# Patient Record
Sex: Female | Born: 1990 | Hispanic: Yes | Marital: Married | Smoking: Former smoker
Health system: Southern US, Community
[De-identification: ages and names within clinical notes are randomized; demographics above are authoritative.]

## PROBLEM LIST (undated history)

## (undated) DIAGNOSIS — Z789 Other specified health status: Secondary | ICD-10-CM

## (undated) HISTORY — DX: Other specified health status: Z78.9

## (undated) HISTORY — PX: DILATION AND CURETTAGE OF UTERUS: SHX78

---
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
## 2008-05-31 ENCOUNTER — Inpatient Hospital Stay (HOSPITAL_COMMUNITY): Admission: AD | Admit: 2008-05-31 | Discharge: 2008-06-02 | Payer: Self-pay | Admitting: Obstetrics

## 2010-11-20 NOTE — Group Therapy Note (Signed)
Yvette Avila, Yvette Avila NO.:  [id number]   MEDICAL RECORD NO.:  [id number]          PATIENT TYPE:  WOC   LOCATION:  [facility]                   FACILITY:  [facility]   PHYSICIAN:  Tinnie Gens, MD        DATE OF BIRTH:  10/05/1990   DATE OF SERVICE:  07/10/2007                                  CLINIC NOTE   CHIEF COMPLAINT:  Follow up molar pregnancy.   HISTORY OF PRESENT ILLNESS:  The patient is an 20 year old gravida 3,  para 1-0-2-1 who had a molar pregnancy evacuated by [facility] by Dr. Nicholaus Bloom  on June 23, 2007.  Since that time, she has had no significant  bleeding and no other complaints.   PAST MEDICAL HISTORY:  Negative.   PAST SURGICAL HISTORY:  Negative.   MEDICATIONS:  None.   ALLERGIES:  NONE KNOWN.   OBSTETRICAL HISTORY:  G3, P1, one vaginal delivery, one miscarriage and  one molar pregnancy.   GYNECOLOGICAL HISTORY:  Cycles are regular, come monthly, last for six  days.  She is currently on no birth control.   FAMILY HISTORY:  Significant for diabetes.   SOCIAL HISTORY:  She is a smoker, half pack a day for the past year.  No  other alcohol or drug use.   REVIEW OF SYSTEMS:  A 14-point review of system reviewed.  Please see  GYN history in the chart which is negative.   PHYSICAL EXAMINATION:  VITAL SIGNS:  On exam, her vitals are as noted in  the chart.  GENERAL:  She is well-developed, well-nourished female in no acute  distress.  ABDOMEN:  Soft, nontender, nondistended.  GU:  Normal external female genitalia.  BUS was normal.  Vagina is pink  and rugated.  Cervix is without lesion.  Uterus is six-weeks size,  anteverted, nontender.  Adnexa are without mass or tenderness.   IMPRESSION:  Molar pregnancy.   PLAN:  1. Beta HCG today.  2. Follow up in one week.  Follow up done until level is 0.  Once that      is done, follow monthly.  3. Start the patient on Femcon 135 one p.o. daily.  I have her not to      get pregnant for at least  six months.           ______________________________  Tinnie Gens, MD     TP/MEDQ  D:  07/10/2007  T:  07/10/2007  Job:  [id number]

## 2010-11-20 NOTE — Op Note (Signed)
Yvette Avila, Yvette Avila            ACCOUNT NO.:  [id number]   MEDICAL RECORD NO.:  [id number]          PATIENT TYPE:  AMB   LOCATION:  [facility]                           [facility]  [facility]   PHYSICIAN:  Allie Bossier, MD        DATE OF BIRTH:  02/14/1991   DATE OF PROCEDURE:  06/23/2007  DATE OF DISCHARGE:                               OPERATIVE REPORT   PREOPERATIVE DIAGNOSIS:  Molar pregnancy.   POSTOPERATIVE DIAGNOSIS:  Molar pregnancy.   PATHOLOGY:  Pending.   SURGEON:  Allie Bossier, MD   ANESTHESIA:  MAC and local.   ANESTHESIOLOGIST:  Raul Del, M.D.   COMPLICATIONS:  None.   ESTIMATED BLOOD LOSS:  Minimal.   SPECIMEN:  Uterine curettings.   DETAILED PROCEDURE AND FINDINGS:  The risks, benefits and alternatives  of the surgery were explained and have been accepted.  Consents were  signed.  We discussed a molar pregnancy, and the follow-up is necessary.  She understands that she will definitely follow up in the clinic in 2  weeks for results,   She was taken to the operating room, placed in the dorsal lithotomy  position.  MAC anesthesia was given.  Her vagina was prepped and draped  in the usual sterile fashion.  Bimanual exam revealed a 8-10 week size  retroverted uterus and nonenlarged adnexa.  The posterior lip of the  cervix was grasped with a single-tooth tenaculum.  A cervical block was  done with 1% lidocaine.  The cervix was dilated easily with Shawnie Pons  dilators to accommodate a #8 curved suction curet.  Suction was applied.  The uterus was emptied of moderate to large amount of products of  conception.  Sharp curettage was done in all quadrants in the fundus of  the uterus until a gritty sensation was appreciated.  The tenaculum was  removed and pressure yielded excellent hemostasis on the tenaculum site.  A repeat bimanual exam revealed her uterus to be much smaller and  firm.  She was taken to the recovery room in stable condition with the  instrument, sponge  and needle counts correct.  Please note that her  bladder was emptied with a Robinson catheter in an in-and-out fashion  prior to surgery.     Allie Bossier, MD  Electronically Signed    MCD/MEDQ  D:  06/23/2007  T:  06/24/2007  Job:  [id number]

## 2011-04-02 LAB — CBC
Platelets: 309
RDW: 14.3

## 2011-04-02 LAB — POCT PREGNANCY, URINE
Operator id: 159681
Preg Test, Ur: POSITIVE

## 2011-04-09 LAB — CBC
HCT: 30.2 — ABNORMAL LOW
Hemoglobin: 10.2 — ABNORMAL LOW
Platelets: 287
Platelets: 320
RBC: 3.81 — ABNORMAL LOW
WBC: 12.9 — ABNORMAL HIGH
WBC: 9.4

## 2011-04-09 LAB — RPR: RPR Ser Ql: NONREACTIVE

## 2011-04-12 LAB — GC/CHLAMYDIA PROBE AMP, GENITAL
Chlamydia, DNA Probe: NEGATIVE
GC Probe Amp, Genital: NEGATIVE

## 2011-04-12 LAB — URINALYSIS, ROUTINE W REFLEX MICROSCOPIC
Bilirubin Urine: NEGATIVE
Nitrite: NEGATIVE
Specific Gravity, Urine: 1.02
pH: 6.5

## 2011-04-12 LAB — URINE MICROSCOPIC-ADD ON

## 2011-04-12 LAB — CBC
Hemoglobin: 12.1
MCHC: 33.9
MCV: 80.7
RBC: 4.42
RDW: 13.3

## 2011-04-12 LAB — CROSSMATCH
ABO/RH(D): O POS
Antibody Screen: NEGATIVE

## 2011-04-12 LAB — HCG, QUANTITATIVE, PREGNANCY: hCG, Beta Chain, Quant, S: 24240 — ABNORMAL HIGH

## 2011-04-12 LAB — DIFFERENTIAL
Basophils Absolute: 0.1
Basophils Relative: 1
Eosinophils Absolute: 0.6
Eosinophils Relative: 7 — ABNORMAL HIGH
Monocytes Absolute: 0.6
Monocytes Relative: 7
Neutro Abs: 4.5

## 2011-04-12 LAB — WET PREP, GENITAL: Trich, Wet Prep: NONE SEEN

## 2011-04-12 LAB — POCT PREGNANCY, URINE: Preg Test, Ur: POSITIVE

## 2012-05-13 LAB — OB RESULTS CONSOLE RPR: RPR: NONREACTIVE

## 2012-05-13 LAB — OB RESULTS CONSOLE ANTIBODY SCREEN: Antibody Screen: NEGATIVE

## 2012-05-13 LAB — OB RESULTS CONSOLE ABO/RH

## 2012-07-08 NOTE — L&D Delivery Note (Signed)
Delivery Note At 7:44 AM a viable female was delivered via  (Presentation: ;  ).  APGAR: , ; weight .   Placenta status: , .  Cord:  with the following complications: .  Cord pH: not done  Anesthesia: Other  Episiotomy:  Lacerations:  Suture Repair: 2.0 vicryl Est. Blood Loss (mL):   Mom to postpartum.  Baby to nursery-stable.  Embry Huss A 10/12/2012, 7:54 AM

## 2012-09-17 LAB — OB RESULTS CONSOLE GBS: GBS: NEGATIVE

## 2012-10-08 ENCOUNTER — Telehealth (HOSPITAL_COMMUNITY): Payer: Self-pay | Admitting: *Deleted

## 2012-10-08 ENCOUNTER — Encounter (HOSPITAL_COMMUNITY): Payer: Self-pay | Admitting: *Deleted

## 2012-10-08 NOTE — Telephone Encounter (Signed)
Preadmission screen [phone] interpreter

## 2012-10-12 ENCOUNTER — Inpatient Hospital Stay (HOSPITAL_COMMUNITY)
Admission: RE | Admit: 2012-10-12 | Discharge: 2012-10-14 | DRG: 775 | Disposition: A | Discharge status: Home / Self Care | Payer: Medicaid Other | Source: Ambulatory Visit | Attending: Obstetrics | Admitting: Obstetrics

## 2012-10-12 ENCOUNTER — Encounter (HOSPITAL_COMMUNITY): Payer: Self-pay

## 2012-10-12 LAB — CBC
MCH: 25.9 pg — ABNORMAL LOW (ref 26.0–34.0)
Platelets: 247 10*3/uL (ref 150–400)
RBC: 4.02 MIL/uL (ref 3.87–5.11)
RDW: 15.1 % (ref 11.5–15.5)
WBC: 11 10*3/uL — ABNORMAL HIGH (ref 4.0–10.5)

## 2012-10-12 LAB — RPR: RPR Ser Ql: NONREACTIVE

## 2012-10-12 LAB — TYPE AND SCREEN

## 2012-10-12 MED ORDER — ACETAMINOPHEN 325 MG PO TABS: 650.0000 mg | ORAL_TABLET | ORAL | Status: DC | PRN

## 2012-10-12 MED ORDER — ONDANSETRON HCL 4 MG PO TABS: 4.0000 mg | ORAL_TABLET | ORAL | Status: DC | PRN

## 2012-10-12 MED ORDER — FERROUS SULFATE 325 (65 FE) MG PO TABS
325.0000 mg | ORAL_TABLET | Freq: Two times a day (BID) | ORAL | Status: DC
  Administered 2012-10-13 – 2012-10-14 (×3): 325 mg via ORAL
  Filled 2012-10-12 (×3): qty 1

## 2012-10-12 MED ORDER — WITCH HAZEL-GLYCERIN EX PADS: 1.0000 "application " | MEDICATED_PAD | CUTANEOUS | Status: DC | PRN

## 2012-10-12 MED ORDER — LACTATED RINGERS IV SOLN
INTRAVENOUS | Status: DC
  Administered 2012-10-12: 04:00:00 via INTRAVENOUS

## 2012-10-12 MED ORDER — BUTORPHANOL TARTRATE 1 MG/ML IJ SOLN
1.0000 mg | INTRAMUSCULAR | Status: DC | PRN
  Administered 2012-10-12: 1 mg via INTRAVENOUS
  Filled 2012-10-12: qty 1

## 2012-10-12 MED ORDER — LACTATED RINGERS IV SOLN: 500.0000 mL | INTRAVENOUS | Status: DC | PRN

## 2012-10-12 MED ORDER — FLEET ENEMA 7-19 GM/118ML RE ENEM: 1.0000 | ENEMA | RECTAL | Status: DC | PRN

## 2012-10-12 MED ORDER — NALOXONE HCL 0.4 MG/ML IJ SOLN
INTRAMUSCULAR | Status: AC
  Filled 2012-10-12: qty 1

## 2012-10-12 MED ORDER — ZOLPIDEM TARTRATE 5 MG PO TABS: 5.0000 mg | ORAL_TABLET | Freq: Every evening | ORAL | Status: DC | PRN

## 2012-10-12 MED ORDER — DIBUCAINE 1 % RE OINT: 1.0000 "application " | TOPICAL_OINTMENT | RECTAL | Status: DC | PRN

## 2012-10-12 MED ORDER — ONDANSETRON HCL 4 MG/2ML IJ SOLN: 4.0000 mg | INTRAMUSCULAR | Status: DC | PRN

## 2012-10-12 MED ORDER — OXYTOCIN BOLUS FROM INFUSION: 500.0000 mL | INTRAVENOUS | Status: DC

## 2012-10-12 MED ORDER — CITRIC ACID-SODIUM CITRATE 334-500 MG/5ML PO SOLN: 30.0000 mL | ORAL | Status: DC | PRN

## 2012-10-12 MED ORDER — DIPHENHYDRAMINE HCL 25 MG PO CAPS: 25.0000 mg | ORAL_CAPSULE | Freq: Four times a day (QID) | ORAL | Status: DC | PRN

## 2012-10-12 MED ORDER — TETANUS-DIPHTH-ACELL PERTUSSIS 5-2.5-18.5 LF-MCG/0.5 IM SUSP
0.5000 mL | Freq: Once | INTRAMUSCULAR | Status: AC
  Administered 2012-10-13: 0.5 mL via INTRAMUSCULAR
  Filled 2012-10-12: qty 0.5

## 2012-10-12 MED ORDER — SENNOSIDES-DOCUSATE SODIUM 8.6-50 MG PO TABS
2.0000 | ORAL_TABLET | Freq: Every day | ORAL | Status: DC
  Administered 2012-10-12 – 2012-10-13 (×2): 2 via ORAL

## 2012-10-12 MED ORDER — OXYCODONE-ACETAMINOPHEN 5-325 MG PO TABS
1.0000 | ORAL_TABLET | ORAL | Status: DC | PRN
  Administered 2012-10-12 – 2012-10-13 (×2): 1 via ORAL
  Filled 2012-10-12 (×2): qty 1
  Filled 2012-10-12: qty 2

## 2012-10-12 MED ORDER — LANOLIN HYDROUS EX OINT: TOPICAL_OINTMENT | CUTANEOUS | Status: DC | PRN

## 2012-10-12 MED ORDER — OXYCODONE-ACETAMINOPHEN 5-325 MG PO TABS
1.0000 | ORAL_TABLET | ORAL | Status: DC | PRN
  Administered 2012-10-12 (×4): 1 via ORAL
  Filled 2012-10-12 (×2): qty 1

## 2012-10-12 MED ORDER — BENZOCAINE-MENTHOL 20-0.5 % EX AERO
1.0000 "application " | INHALATION_SPRAY | CUTANEOUS | Status: DC | PRN
  Filled 2012-10-12: qty 56

## 2012-10-12 MED ORDER — IBUPROFEN 600 MG PO TABS
600.0000 mg | ORAL_TABLET | Freq: Four times a day (QID) | ORAL | Status: DC | PRN
  Administered 2012-10-12: 600 mg via ORAL
  Filled 2012-10-12 (×8): qty 1

## 2012-10-12 MED ORDER — IBUPROFEN 600 MG PO TABS
600.0000 mg | ORAL_TABLET | Freq: Four times a day (QID) | ORAL | Status: DC
  Administered 2012-10-12 – 2012-10-14 (×8): 600 mg via ORAL
  Filled 2012-10-12: qty 1

## 2012-10-12 MED ORDER — ONDANSETRON HCL 4 MG/2ML IJ SOLN: 4.0000 mg | Freq: Four times a day (QID) | INTRAMUSCULAR | Status: DC | PRN

## 2012-10-12 MED ORDER — OXYTOCIN 40 UNITS IN LACTATED RINGERS INFUSION - SIMPLE MED
62.5000 mL/h | INTRAVENOUS | Status: DC
  Administered 2012-10-12: 500 mL/h via INTRAVENOUS
  Filled 2012-10-12: qty 1000

## 2012-10-12 MED ORDER — LIDOCAINE HCL (PF) 1 % IJ SOLN
30.0000 mL | INTRAMUSCULAR | Status: DC | PRN
  Administered 2012-10-12: 30 mL via SUBCUTANEOUS
  Filled 2012-10-12 (×2): qty 30

## 2012-10-12 MED ORDER — PRENATAL MULTIVITAMIN CH
1.0000 | ORAL_TABLET | Freq: Every day | ORAL | Status: DC
  Administered 2012-10-12 – 2012-10-14 (×3): 1 via ORAL
  Filled 2012-10-12 (×3): qty 1

## 2012-10-12 MED ORDER — SIMETHICONE 80 MG PO CHEW: 80.0000 mg | CHEWABLE_TABLET | ORAL | Status: DC | PRN

## 2012-10-12 NOTE — H&P (Signed)
This is Dr. Francoise Ceo dictating the history and physical on  Yvette Avila she is a 22 year old gravida 4 para 2012 [facility] 4/12 2039 weeks and 2 days negative GBS she was admitted in labor or with her membranes leaking started at 3:30 this morning on exam her cervix is 6 cm 100% vertex -2 and 4 interrupted the fluids clear and she is inactive labor Past medical history negative Past surgical history negative Social history negative System review noncontributory Physical exam revealed well-developed female in labor HEENT negative Breasts negative Heart regular rhythm no murmurs no gallops Lungs clear to P&A Abdomen term Pelvic as described above Extremities negative

## 2012-10-13 LAB — CBC
HCT: 30.3 % — ABNORMAL LOW (ref 36.0–46.0)
Hemoglobin: 9.7 g/dL — ABNORMAL LOW (ref 12.0–15.0)
MCH: 25.6 pg — ABNORMAL LOW (ref 26.0–34.0)
MCHC: 32 g/dL (ref 30.0–36.0)
MCV: 79.9 fL (ref 78.0–100.0)
RDW: 15.1 % (ref 11.5–15.5)

## 2012-10-13 NOTE — Progress Notes (Signed)
UR chart review completed.  

## 2012-10-13 NOTE — Progress Notes (Signed)
Patient ID: Yvette Avila, female   DOB: [birth year], 22 y.o.   MRN: [id number] Postpartum day one Vital signs normal Fundus firm Lochia moderate No complaints

## 2012-10-14 NOTE — Discharge Summary (Signed)
Obstetric Discharge Summary Reason for Admission: onset of labor Prenatal Procedures: none Intrapartum Procedures: spontaneous vaginal delivery Postpartum Procedures: none Complications-Operative and Postpartum: none Hemoglobin  Date Value Range Status  10/13/2012 9.7* 12.0 - 15.0 g/dL Final     HCT  Date Value Range Status  10/13/2012 30.3* 36.0 - 46.0 % Final    Physical Exam:  General: alert Lochia: appropriate Uterine Fundus: firm Incision: healing well DVT Evaluation: No evidence of DVT seen on physical exam.  Discharge Diagnoses: Term Pregnancy-delivered  Discharge Information: Date: 10/14/2012 Activity: pelvic rest Diet: routine Medications: Percocet Condition: stable Instructions: refer to practice specific booklet Discharge to: home Follow-up Information   Follow up with [name], MD. Schedule an appointment as soon as possible for Avila visit in 6 weeks.   Contact information:   [address] [place] [address] [phone]       Newborn Data: Live born female  Birth Weight: 9 lb 4.7 oz (4215 g) APGAR: 9, 9  Home with mother.  Yvette Avila 10/14/2012, 6:53 AM

## 2012-10-15 ENCOUNTER — Inpatient Hospital Stay (HOSPITAL_COMMUNITY): Admission: AD | Admit: 2012-10-15 | Payer: Self-pay | Source: Ambulatory Visit | Admitting: Obstetrics

## 2013-11-10 ENCOUNTER — Encounter (HOSPITAL_COMMUNITY): Payer: Self-pay | Admitting: Emergency Medicine

## 2013-11-10 ENCOUNTER — Emergency Department (INDEPENDENT_AMBULATORY_CARE_PROVIDER_SITE_OTHER)
Admission: EM | Admit: 2013-11-10 | Discharge: 2013-11-10 | Disposition: A | Discharge status: Home / Self Care | Payer: Self-pay | Source: Home / Self Care | Attending: Family Medicine | Admitting: Family Medicine

## 2013-11-10 DIAGNOSIS — M545 Low back pain, unspecified: Secondary | ICD-10-CM

## 2013-11-10 MED ORDER — CYCLOBENZAPRINE HCL 5 MG PO TABS: 5.0000 mg | ORAL_TABLET | Freq: Every evening | ORAL | Status: DC | PRN

## 2013-11-10 MED ORDER — DICLOFENAC SODIUM 50 MG PO TBEC: 50.0000 mg | DELAYED_RELEASE_TABLET | Freq: Two times a day (BID) | ORAL | Status: DC | PRN
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
## 2013-11-10 NOTE — Discharge Instructions (Signed)
Thank you for coming in today. Come back or go to the emergency room if you notice new weakness new numbness problems walking or bowel or bladder problems.  Back Exercises Back exercises help treat and prevent back injuries. The goal of back exercises is to increase the strength of your abdominal and back muscles and the flexibility of your back. These exercises should be started when you no longer have back pain. Back exercises include:  Pelvic Tilt. Lie on your back with your knees bent. Tilt your pelvis until the lower part of your back is against the floor. Hold this position 5 to 10 sec and repeat 5 to 10 times.  Knee to Chest. Pull first 1 knee up against your chest and hold for 20 to 30 seconds, repeat this with the other knee, and then both knees. This may be done with the other leg straight or bent, whichever feels better.  Sit-Ups or Curl-Ups. Bend your knees 90 degrees. Start with tilting your pelvis, and do a partial, slow sit-up, lifting your trunk only 30 to 45 degrees off the floor. Take at least 2 to 3 seconds for each sit-up. Do not do sit-ups with your knees out straight. If partial sit-ups are difficult, simply do the above but with only tightening your abdominal muscles and holding it as directed.  Hip-Lift. Lie on your back with your knees flexed 90 degrees. Push down with your feet and shoulders as you raise your hips a couple inches off the floor; hold for 10 seconds, repeat 5 to 10 times.  Back arches. Lie on your stomach, propping yourself up on bent elbows. Slowly press on your hands, causing an arch in your low back. Repeat 3 to 5 times. Any initial stiffness and discomfort should lessen with repetition over time.  Shoulder-Lifts. Lie face down with arms beside your body. Keep hips and torso pressed to floor as you slowly lift your head and shoulders off the floor. Do not overdo your exercises, especially in the beginning. Exercises may cause you some mild back discomfort  which lasts for a few minutes; however, if the pain is more severe, or lasts for more than 15 minutes, do not continue exercises until you see your caregiver. Improvement with exercise therapy for back problems is slow.  See your caregivers for assistance with developing a proper back exercise program. Document Released: 08/01/2004 Document Revised: 09/16/2011 Document Reviewed: 04/25/2011 [facility] Patient Information 2014 [place], [place].

## 2013-11-10 NOTE — ED Notes (Signed)
Back pain onset today °

## 2014-05-09 ENCOUNTER — Encounter (HOSPITAL_COMMUNITY): Payer: Self-pay | Admitting: Emergency Medicine

## 2015-06-23 ENCOUNTER — Ambulatory Visit (INDEPENDENT_AMBULATORY_CARE_PROVIDER_SITE_OTHER): Payer: Self-pay

## 2015-06-23 DIAGNOSIS — Z23 Encounter for immunization: Secondary | ICD-10-CM

## 2015-08-14 ENCOUNTER — Other Ambulatory Visit (INDEPENDENT_AMBULATORY_CARE_PROVIDER_SITE_OTHER): Payer: Self-pay

## 2015-08-14 DIAGNOSIS — Z0000 Encounter for general adult medical examination without abnormal findings: Secondary | ICD-10-CM

## 2015-08-15 LAB — COMPREHENSIVE METABOLIC PANEL
A/G RATIO: 1.8 (ref 1.1–2.5)
ALT: 16 IU/L (ref 0–32)
AST: 11 IU/L (ref 0–40)
Albumin: 4.5 g/dL (ref 3.5–5.5)
Alkaline Phosphatase: 52 IU/L (ref 39–117)
BILIRUBIN TOTAL: 0.7 mg/dL (ref 0.0–1.2)
BUN/Creatinine Ratio: 13 (ref 8–20)
BUN: 9 mg/dL (ref 6–20)
CHLORIDE: 102 mmol/L (ref 96–106)
CO2: 21 mmol/L (ref 18–29)
Calcium: 9.1 mg/dL (ref 8.7–10.2)
Creatinine, Ser: 0.72 mg/dL (ref 0.57–1.00)
GFR calc non Af Amer: 116 mL/min/{1.73_m2} (ref >59)
GFR, EST AFRICAN AMERICAN: 134 mL/min/{1.73_m2} (ref >59)
Globulin, Total: 2.5 g/dL (ref 1.5–4.5)
Glucose: 87 mg/dL (ref 65–99)
POTASSIUM: 4.3 mmol/L (ref 3.5–5.2)
Sodium: 137 mmol/L (ref 134–144)
Total Protein: 7 g/dL (ref 6.0–8.5)

## 2015-08-15 LAB — LIPID PANEL W/O CHOL/HDL RATIO
Cholesterol, Total: 168 mg/dL (ref 100–199)
HDL: 56 mg/dL (ref >39)
LDL Calculated: 92 mg/dL (ref 0–99)
TRIGLYCERIDES: 100 mg/dL (ref 0–149)
VLDL Cholesterol Cal: 20 mg/dL (ref 5–40)

## 2015-11-14 ENCOUNTER — Encounter: Payer: Self-pay | Admitting: Internal Medicine

## 2015-11-14 ENCOUNTER — Ambulatory Visit (INDEPENDENT_AMBULATORY_CARE_PROVIDER_SITE_OTHER): Payer: Self-pay | Admitting: Internal Medicine

## 2015-11-14 VITALS — BP 110/74 | HR 92 | Temp 98.4°F | Resp 18 | Ht 62.25 in | Wt 164.5 lb

## 2015-11-14 DIAGNOSIS — B349 Viral infection, unspecified: Secondary | ICD-10-CM

## 2015-11-14 NOTE — Patient Instructions (Addendum)
Try Dayquil during day and Nyquil at night. Ibuprofen 200 mg 2-4 tabs every 6 hours as needed for back pain--take with food Drink water small amounts constantly Stop smoking (PERIOD)

## 2015-11-14 NOTE — Progress Notes (Signed)
   Subjective:    Patient ID: Yvette Avila, female    DOB: [birth year], 25 y.o.   MRN: [id number]  HPI   4 days ago, developed fever up to 101.0 orally and a cough.  3 days ago, aching and feverish, coughing with productive of yellow green phlegm.  2 nights ago, developed upper thoracic back pain that extended down into lumbar area yesterday.  Took Tylenol 1000 mg and that definitely helped back pain.  Did also take Theraflu, which helped, has only taken twice.  Did not take Tylenol the same day.  Coughing hurts her back a bit.  Aching not so much of a problem today. No dyspnea. No N or V.  No diarrhea.  No dysuria, no hematuria. No fever since 2nd day of illness.  No appetite, but drinking fluids.  Did get an Influenza vaccine this year.  No current outpatient prescriptions on file.   No Known Allergies      Review of Systems     Objective:   Physical Exam Congested cough, mildly ill appearing. HEENT;  PERRL, EOMI, TMs pearly gray, nasal mucosa swollen and red, throat without injection, MMM Neck:  Supple, no adenopathy Chest:  Upper airway noise that improves with cough, otherwise CTA with god air movement, resonant to percussion. Abd;  S, NT, No HSM or mass, +BS       Assessment & Plan:  Viral Syndrome:  Push fluids, If using cold remedy with Tylenol or other analgesic, to not take the same analgesic separately.   Try Dayquil/Nyquil combination Cool mist humidifier. Call if worsen with SOB or recurrent fever. Call if develops

## 2016-01-31 ENCOUNTER — Other Ambulatory Visit: Payer: Self-pay

## 2016-01-31 DIAGNOSIS — R7611 Nonspecific reaction to tuberculin skin test without active tuberculosis: Secondary | ICD-10-CM

## 2016-01-31 DIAGNOSIS — T50A95A Adverse effect of other bacterial vaccines, initial encounter: Principal | ICD-10-CM

## 2016-02-02 ENCOUNTER — Other Ambulatory Visit: Payer: Self-pay

## 2016-02-02 DIAGNOSIS — R7611 Nonspecific reaction to tuberculin skin test without active tuberculosis: Secondary | ICD-10-CM

## 2016-02-02 DIAGNOSIS — T50A95A Adverse effect of other bacterial vaccines, initial encounter: Principal | ICD-10-CM

## 2016-02-02 LAB — SPECIMEN STATUS REPORT

## 2016-02-02 LAB — QUANTIFERON TB GOLD ASSAY (BLOOD)

## 2016-02-08 ENCOUNTER — Other Ambulatory Visit: Payer: Self-pay | Admitting: Internal Medicine

## 2016-02-08 DIAGNOSIS — R7612 Nonspecific reaction to cell mediated immunity measurement of gamma interferon antigen response without active tuberculosis: Secondary | ICD-10-CM

## 2016-02-08 LAB — QUANTIFERON IN TUBE
QFT TB AG MINUS NIL VALUE: >10 IU/mL
QUANTIFERON NIL VALUE: 0.23 [IU]/mL
QUANTIFERON TB AG VALUE: >10 IU/mL
QUANTIFERON TB GOLD: POSITIVE — AB

## 2016-02-08 LAB — QUANTIFERON TB GOLD ASSAY (BLOOD)

## 2016-02-20 ENCOUNTER — Other Ambulatory Visit: Payer: Self-pay | Admitting: Infectious Disease

## 2016-02-20 ENCOUNTER — Ambulatory Visit
Admission: RE | Admit: 2016-02-20 | Discharge: 2016-02-20 | Disposition: A | Discharge status: Home / Self Care | Payer: No Typology Code available for payment source | Source: Ambulatory Visit | Attending: Infectious Disease | Admitting: Infectious Disease

## 2016-02-20 DIAGNOSIS — Z111 Encounter for screening for respiratory tuberculosis: Secondary | ICD-10-CM

## 2016-03-05 ENCOUNTER — Ambulatory Visit: Payer: Self-pay | Admitting: Internal Medicine

## 2017-05-12 ENCOUNTER — Ambulatory Visit (INDEPENDENT_AMBULATORY_CARE_PROVIDER_SITE_OTHER): Payer: Self-pay

## 2017-05-12 DIAGNOSIS — Z23 Encounter for immunization: Secondary | ICD-10-CM

## 2018-10-19 ENCOUNTER — Telehealth: Payer: Self-pay | Admitting: Internal Medicine

## 2018-10-19 MED ORDER — TRIAMCINOLONE ACETONIDE 0.1 % EX CREA: 1.0000 "application " | TOPICAL_CREAM | Freq: Two times a day (BID) | CUTANEOUS | 0 refills | Status: DC

## 2018-10-19 NOTE — Telephone Encounter (Signed)
texted me photos of obvious poison ivy on both arms.  Fairly extensive involvement Sending in Triamcinolone cream.

## 2019-02-09 ENCOUNTER — Other Ambulatory Visit (INDEPENDENT_AMBULATORY_CARE_PROVIDER_SITE_OTHER): Payer: Self-pay

## 2019-02-09 ENCOUNTER — Other Ambulatory Visit: Payer: Self-pay

## 2019-02-09 DIAGNOSIS — N912 Amenorrhea, unspecified: Secondary | ICD-10-CM

## 2019-02-09 LAB — POCT URINE PREGNANCY: Preg Test, Ur: POSITIVE — AB

## 2019-02-22 MED ORDER — PRENATAL PLUS IRON 29-1 MG PO TABS: ORAL_TABLET | ORAL | 11 refills | Status: DC

## 2019-02-22 NOTE — Addendum Note (Signed)
Addended by: Marcelino Duster on: 02/22/2019 01:57 PM   Modules accepted: Orders

## 2019-02-22 NOTE — Progress Notes (Signed)
Needs Rx for pnv and letter for adopt a mom.

## 2019-02-23 ENCOUNTER — Encounter: Payer: Self-pay | Admitting: Internal Medicine

## 2019-04-06 ENCOUNTER — Encounter: Payer: Self-pay | Admitting: Obstetrics and Gynecology

## 2019-04-06 ENCOUNTER — Other Ambulatory Visit: Payer: Self-pay

## 2019-04-06 ENCOUNTER — Ambulatory Visit (INDEPENDENT_AMBULATORY_CARE_PROVIDER_SITE_OTHER): Payer: No Typology Code available for payment source | Admitting: Obstetrics and Gynecology

## 2019-04-06 DIAGNOSIS — Z1151 Encounter for screening for human papillomavirus (HPV): Secondary | ICD-10-CM

## 2019-04-06 DIAGNOSIS — Z3A13 13 weeks gestation of pregnancy: Secondary | ICD-10-CM

## 2019-04-06 DIAGNOSIS — Z113 Encounter for screening for infections with a predominantly sexual mode of transmission: Secondary | ICD-10-CM

## 2019-04-06 DIAGNOSIS — Z3480 Encounter for supervision of other normal pregnancy, unspecified trimester: Secondary | ICD-10-CM

## 2019-04-06 DIAGNOSIS — O09A0 Supervision of pregnancy with history of molar pregnancy, unspecified trimester: Secondary | ICD-10-CM

## 2019-04-06 DIAGNOSIS — N898 Other specified noninflammatory disorders of vagina: Secondary | ICD-10-CM

## 2019-04-06 DIAGNOSIS — Z124 Encounter for screening for malignant neoplasm of cervix: Secondary | ICD-10-CM

## 2019-04-06 NOTE — Progress Notes (Signed)
Subjective:  Yvette Avila is a 28 y.o. [id number] at [redacted]w[redacted]d being seen today for first OB visit. EDD by certain LMP. No chronic medical problems or medications. H/O TSVD x 3 without problems. H/O first trimester SAB/Molar pregnancy in 2008.   She is currently monitored for the following issues for this low-risk pregnancy and has Supervision of other normal pregnancy, antepartum and H/O molar pregnancy, antepartum on their problem list.  Patient reports no complaints.  Contractions: Not present. Vag. Bleeding: None.  Movement: Absent. Denies leaking of fluid.   The following portions of the patient's history were reviewed and updated as appropriate: allergies, current medications, past family history, past medical history, past social history, past surgical history and problem list. Problem list updated.  Objective:   Vitals:   04/06/19 1114  BP: 107/70  Pulse: 70  Temp: 97.7 F (36.5 C)  Weight: 181 lb 8 oz (82.3 kg)    Fetal Status: Fetal Heart Rate (bpm): 148   Movement: Absent     General:  Alert, oriented and cooperative. Patient is in no acute distress.  Skin: Skin is warm and dry. No rash noted.   Cardiovascular: Normal heart rate noted  Respiratory: Normal respiratory effort, no problems with respiration noted  Abdomen: Soft, gravid, appropriate for gestational age. Pain/Pressure: Absent     Pelvic:  Cervical exam performed        Extremities: Normal range of motion.  Edema: None  Mental Status: Normal mood and affect. Normal behavior. Normal judgment and thought content.  Breast sym supple no masses nipple discharge or adenopathy  Urinalysis:      Assessment and Plan:  Pregnancy: [id number] at [redacted]w[redacted]d  1. Supervision of other normal pregnancy, antepartum Prenatal labs, care and genetic testing reviewed with pt. - Enroll Patient in Babyscripts - Babyscripts Schedule Optimization - Obstetric Panel, Including HIV - Cervicovaginal ancillary only( [place]) - Cytology -  PAP - Genetic Screening  2. H/O molar pregnancy, antepartum   Preterm labor symptoms and general obstetric precautions including but not limited to vaginal bleeding, contractions, leaking of fluid and fetal movement were reviewed in detail with the patient. Please refer to After Visit Summary for other counseling recommendations.  Return in about 4 weeks (around 05/04/2019) for OB visit, face to face.   Chancy Milroy, MD

## 2019-04-06 NOTE — Progress Notes (Signed)
Pt presents for NOB visit. No complaints today per pt.

## 2019-04-06 NOTE — Patient Instructions (Signed)
First Trimester of Pregnancy The first trimester of pregnancy is from week 1 until the end of week 13 (months 1 through 3). A week after a sperm fertilizes an egg, the egg will implant on the wall of the uterus. This embryo will begin to develop into a baby. Genes from you and your partner will form the baby. The female genes will determine whether the baby will be a boy or a girl. At 6-8 weeks, the eyes and face will be formed, and the heartbeat can be seen on ultrasound. At the end of 12 weeks, all the baby's organs will be formed. Now that you are pregnant, you will want to do everything you can to have a healthy baby. Two of the most important things are to get good prenatal care and to follow your health care provider's instructions. Prenatal care is all the medical care you receive before the baby's birth. This care will help prevent, find, and treat any problems during the pregnancy and childbirth. Body changes during your first trimester Your body goes through many changes during pregnancy. The changes vary from woman to woman.  You may gain or lose a couple of pounds at first.  You may feel sick to your stomach (nauseous) and you may throw up (vomit). If the vomiting is uncontrollable, call your health care provider.  You may tire easily.  You may develop headaches that can be relieved by medicines. All medicines should be approved by your health care provider.  You may urinate more often. Painful urination may mean you have a bladder infection.  You may develop heartburn as a result of your pregnancy.  You may develop constipation because certain hormones are causing the muscles that push stool through your intestines to slow down.  You may develop hemorrhoids or swollen veins (varicose veins).  Your breasts may begin to grow larger and become tender. Your nipples may stick out more, and the tissue that surrounds them (areola) may become darker.  Your gums may bleed and may be  sensitive to brushing and flossing.  Dark spots or blotches (chloasma, mask of pregnancy) may develop on your face. This will likely fade after the baby is born.  Your menstrual periods will stop.  You may have a loss of appetite.  You may develop cravings for certain kinds of food.  You may have changes in your emotions from day to day, such as being excited to be pregnant or being concerned that something may go wrong with the pregnancy and baby.  You may have more vivid and strange dreams.  You may have changes in your hair. These can include thickening of your hair, rapid growth, and changes in texture. Some women also have hair loss during or after pregnancy, or hair that feels dry or thin. Your hair will most likely return to normal after your baby is born. What to expect at prenatal visits During a routine prenatal visit:  You will be weighed to make sure you and the baby are growing normally.  Your blood pressure will be taken.  Your abdomen will be measured to track your baby's growth.  The fetal heartbeat will be listened to between weeks 10 and 14 of your pregnancy.  Test results from any previous visits will be discussed. Your health care provider may ask you:  How you are feeling.  If you are feeling the baby move.  If you have had any abnormal symptoms, such as leaking fluid, bleeding, severe headaches, or abdominal   cramping.  If you are using any tobacco products, including cigarettes, chewing tobacco, and electronic cigarettes.  If you have any questions. Other tests that may be performed during your first trimester include:  Blood tests to find your blood type and to check for the presence of any previous infections. The tests will also be used to check for low iron levels (anemia) and protein on red blood cells (Rh antibodies). Depending on your risk factors, or if you previously had diabetes during pregnancy, you may have tests to check for high blood sugar  that affects pregnant women (gestational diabetes).  Urine tests to check for infections, diabetes, or protein in the urine.  An ultrasound to confirm the proper growth and development of the baby.  Fetal screens for spinal cord problems (spina bifida) and Down syndrome.  HIV (human immunodeficiency virus) testing. Routine prenatal testing includes screening for HIV, unless you choose not to have this test.  You may need other tests to make sure you and the baby are doing well. Follow these instructions at home: Medicines  Follow your health care provider's instructions regarding medicine use. Specific medicines may be either safe or unsafe to take during pregnancy.  Take a prenatal vitamin that contains at least 600 micrograms (mcg) of folic acid.  If you develop constipation, try taking a stool softener if your health care provider approves. Eating and drinking   Eat a balanced diet that includes fresh fruits and vegetables, whole grains, good sources of protein such as meat, eggs, or tofu, and low-fat dairy. Your health care provider will help you determine the amount of weight gain that is right for you.  Avoid raw meat and uncooked cheese. These carry germs that can cause birth defects in the baby.  Eating four or five small meals rather than three large meals a day may help relieve nausea and vomiting. If you start to feel nauseous, eating a few soda crackers can be helpful. Drinking liquids between meals, instead of during meals, also seems to help ease nausea and vomiting.  Limit foods that are high in fat and processed sugars, such as fried and sweet foods.  To prevent constipation: ? Eat foods that are high in fiber, such as fresh fruits and vegetables, whole grains, and beans. ? Drink enough fluid to keep your urine clear or pale yellow. Activity  Exercise only as directed by your health care provider. Most women can continue their usual exercise routine during  pregnancy. Try to exercise for 30 minutes at least 5 days a week. Exercising will help you: ? Control your weight. ? Stay in shape. ? Be prepared for labor and delivery.  Experiencing pain or cramping in the lower abdomen or lower back is a good sign that you should stop exercising. Check with your health care provider before continuing with normal exercises.  Try to avoid standing for long periods of time. Move your legs often if you must stand in one place for a long time.  Avoid heavy lifting.  Wear low-heeled shoes and practice good posture.  You may continue to have sex unless your health care provider tells you not to. Relieving pain and discomfort  Wear a good support bra to relieve breast tenderness.  Take warm sitz baths to soothe any pain or discomfort caused by hemorrhoids. Use hemorrhoid cream if your health care provider approves.  Rest with your legs elevated if you have leg cramps or low back pain.  If you develop varicose veins in   your legs, wear support hose. Elevate your feet for 15 minutes, 3-4 times a day. Limit salt in your diet. Prenatal care  Schedule your prenatal visits by the twelfth week of pregnancy. They are usually scheduled monthly at first, then more often in the last 2 months before delivery.  Write down your questions. Take them to your prenatal visits.  Keep all your prenatal visits as told by your health care provider. This is important. Safety  Wear your seat belt at all times when driving.  Make a list of emergency phone numbers, including numbers for family, friends, the hospital, and police and fire departments. General instructions  Ask your health care provider for a referral to a local prenatal education class. Begin classes no later than the beginning of month 6 of your pregnancy.  Ask for help if you have counseling or nutritional needs during pregnancy. Your health care provider can offer advice or refer you to specialists for help  with various needs.  Do not use hot tubs, steam rooms, or saunas.  Do not douche or use tampons or scented sanitary pads.  Do not cross your legs for long periods of time.  Avoid cat litter boxes and soil used by cats. These carry germs that can cause birth defects in the baby and possibly loss of the fetus by miscarriage or stillbirth.  Avoid all smoking, herbs, alcohol, and medicines not prescribed by your health care provider. Chemicals in these products affect the formation and growth of the baby.  Do not use any products that contain nicotine or tobacco, such as cigarettes and e-cigarettes. If you need help quitting, ask your health care provider. You may receive counseling support and other resources to help you quit.  Schedule a dentist appointment. At home, brush your teeth with a soft toothbrush and be gentle when you floss. Contact a health care provider if:  You have dizziness.  You have mild pelvic cramps, pelvic pressure, or nagging pain in the abdominal area.  You have persistent nausea, vomiting, or diarrhea.  You have a bad smelling vaginal discharge.  You have pain when you urinate.  You notice increased swelling in your face, hands, legs, or ankles.  You are exposed to fifth disease or chickenpox.  You are exposed to German measles (rubella) and have never had it. Get help right away if:  You have a fever.  You are leaking fluid from your vagina.  You have spotting or bleeding from your vagina.  You have severe abdominal cramping or pain.  You have rapid weight gain or loss.  You vomit blood or material that looks like coffee grounds.  You develop a severe headache.  You have shortness of breath.  You have any kind of trauma, such as from a fall or a car accident. Summary  The first trimester of pregnancy is from week 1 until the end of week 13 (months 1 through 3).  Your body goes through many changes during pregnancy. The changes vary from  woman to woman.  You will have routine prenatal visits. During those visits, your health care provider will examine you, discuss any test results you may have, and talk with you about how you are feeling. This information is not intended to replace advice given to you by your health care provider. Make sure you discuss any questions you have with your health care provider. Document Released: 06/18/2001 Document Revised: 06/06/2017 Document Reviewed: 06/05/2016 Elsevier Patient Education  2020 Elsevier Inc.  

## 2019-04-07 LAB — OBSTETRIC PANEL, INCLUDING HIV
Antibody Screen: NEGATIVE
Basophils Absolute: 0 10*3/uL (ref 0.0–0.2)
Basos: 1 %
EOS (ABSOLUTE): 0.1 10*3/uL (ref 0.0–0.4)
Eos: 1 %
HIV Screen 4th Generation wRfx: NONREACTIVE
Hematocrit: 33 % — ABNORMAL LOW (ref 34.0–46.6)
Hemoglobin: 10.9 g/dL — ABNORMAL LOW (ref 11.1–15.9)
Hepatitis B Surface Ag: NEGATIVE
Immature Grans (Abs): 0 10*3/uL (ref 0.0–0.1)
Immature Granulocytes: 0 %
Lymphocytes Absolute: 2.4 10*3/uL (ref 0.7–3.1)
Lymphs: 28 %
MCH: 26.4 pg — ABNORMAL LOW (ref 26.6–33.0)
MCHC: 33 g/dL (ref 31.5–35.7)
MCV: 80 fL (ref 79–97)
Monocytes Absolute: 0.6 10*3/uL (ref 0.1–0.9)
Monocytes: 7 %
Neutrophils Absolute: 5.2 10*3/uL (ref 1.4–7.0)
Neutrophils: 63 %
Platelets: 298 10*3/uL (ref 150–450)
RBC: 4.13 x10E6/uL (ref 3.77–5.28)
RDW: 12.8 % (ref 11.7–15.4)
RPR Ser Ql: NONREACTIVE
Rh Factor: POSITIVE
Rubella Antibodies, IGG: 7.3 index (ref >0.99)
WBC: 8.3 10*3/uL (ref 3.4–10.8)

## 2019-04-07 LAB — CERVICOVAGINAL ANCILLARY ONLY
Bacterial Vaginitis (gardnerella): NEGATIVE
Candida Glabrata: NEGATIVE
Candida Vaginitis: NEGATIVE
Chlamydia: NEGATIVE
Molecular Disclaimer: NEGATIVE
Molecular Disclaimer: NEGATIVE
Molecular Disclaimer: NEGATIVE
Molecular Disclaimer: NEGATIVE
Molecular Disclaimer: NORMAL
Molecular Disclaimer: NORMAL
Neisseria Gonorrhea: NEGATIVE
Trichomonas: NEGATIVE

## 2019-04-09 LAB — CYTOLOGY - PAP
Diagnosis: UNDETERMINED — AB
High risk HPV: NEGATIVE

## 2019-04-09 LAB — CULTURE, OB URINE

## 2019-04-09 LAB — URINE CULTURE, OB REFLEX

## 2019-04-12 ENCOUNTER — Encounter: Payer: Self-pay | Admitting: Obstetrics and Gynecology

## 2019-04-19 ENCOUNTER — Telehealth: Payer: Self-pay

## 2019-04-19 ENCOUNTER — Encounter: Payer: Self-pay | Admitting: Obstetrics and Gynecology

## 2019-04-19 DIAGNOSIS — D563 Thalassemia minor: Secondary | ICD-10-CM | POA: Insufficient documentation

## 2019-04-19 NOTE — Telephone Encounter (Signed)
Error. Sahalie Beth RN 

## 2019-04-19 NOTE — Telephone Encounter (Signed)
-----   Message from Chancy Milroy, MD sent at 04/19/2019  9:16 AM EDT ----- Please refer for genetic counseling due to silent carrier of alpha-thal Thanks Legrand Como

## 2019-04-20 ENCOUNTER — Telehealth: Payer: Self-pay

## 2019-04-20 NOTE — Progress Notes (Signed)
This is an adopt a mom patient so referral to MFM would be out of pocket

## 2019-04-20 NOTE — Telephone Encounter (Signed)
Left message using interpreter. Kathrene Alu RN

## 2019-04-20 NOTE — Telephone Encounter (Signed)
-----   Message from Michael L Ervin, MD sent at 04/19/2019  9:16 AM EDT ----- Please refer for genetic counseling due to silent carrier of alpha-thal Thanks Michael 

## 2019-04-30 ENCOUNTER — Encounter: Payer: Self-pay | Admitting: Obstetrics and Gynecology

## 2019-05-04 ENCOUNTER — Encounter: Payer: Self-pay | Admitting: Obstetrics and Gynecology

## 2019-05-04 ENCOUNTER — Other Ambulatory Visit: Payer: Self-pay

## 2019-05-04 ENCOUNTER — Ambulatory Visit (INDEPENDENT_AMBULATORY_CARE_PROVIDER_SITE_OTHER): Payer: Self-pay | Admitting: Obstetrics and Gynecology

## 2019-05-04 VITALS — BP 116/73 | HR 84 | Wt 182.0 lb

## 2019-05-04 DIAGNOSIS — D563 Thalassemia minor: Secondary | ICD-10-CM

## 2019-05-04 DIAGNOSIS — Z3A17 17 weeks gestation of pregnancy: Secondary | ICD-10-CM

## 2019-05-04 DIAGNOSIS — Z3480 Encounter for supervision of other normal pregnancy, unspecified trimester: Secondary | ICD-10-CM

## 2019-05-04 DIAGNOSIS — O09A0 Supervision of pregnancy with history of molar pregnancy, unspecified trimester: Secondary | ICD-10-CM

## 2019-05-04 NOTE — Progress Notes (Signed)
ROB   CC: None    

## 2019-05-04 NOTE — Patient Instructions (Signed)
Segundo trimestre de embarazo Second Trimester of Pregnancy  El segundo trimestre va desde la semana14 hasta la 27 (desde el mes 4 hasta el 6). Este suele ser el momento en el que mejor se siente. En general, las nuseas matutinas han disminuido o han desaparecido completamente. Tendr ms energa y podr aumentarle el apetito. El beb en gestacin se desarrolla rpidamente. Hacia el final del sexto mes, el beb mide aproximadamente 9 pulgadas (23 cm) y pesa alrededor de 1 libras (700 g). Es probable que sienta al beb moverse entre las 18 y 20 semanas del embarazo. Siga estas indicaciones en su casa: Medicamentos  Tome los medicamentos de venta libre y los recetados solamente como se lo haya indicado el mdico. Algunos medicamentos son seguros para tomar durante el embarazo y otros no lo son.  Tome vitaminas prenatales que contengan por lo menos 600microgramos (?g) de cido flico.  Si tiene dificultad para mover el intestino (estreimiento), tome un medicamento para ablandar las heces (laxante) si su mdico se lo autoriza. Comida y bebida   Ingiera alimentos saludables de manera regular.  No coma carne cruda ni quesos sin cocinar.  Si obtiene poca cantidad de calcio de los alimentos que ingiere, consulte a su mdico sobre la posibilidad de tomar un suplemento diario de calcio.  Evite el consumo de alimentos ricos en grasas y azcares, como los alimentos fritos y los dulces.  Si tiene malestar estomacal (nuseas) o devuelve (vomita): ? Ingiera 4 o 5comidas pequeas por da en lugar de 3abundantes. ? Intente comer algunas galletitas saladas. ? Beba lquidos entre las comidas, en lugar de hacerlo durante estas.  Para evitar el estreimiento: ? Consuma alimentos ricos en fibra, como frutas y verduras frescas, cereales integrales y frijoles. ? Beba suficiente lquido para mantener el pis (orina) claro o de color amarillo plido. Actividad  Haga ejercicios solamente como se lo haya  indicado el mdico. Interrumpa la actividad fsica si comienza a tener calambres.  No haga ejercicio si hace demasiado calor, hay demasiada humedad o se encuentra en un lugar de mucha altura (altitud alta).  Evite levantar pesos excesivos.  Use zapatos con tacones bajos. Mantenga una buena postura al sentarse y pararse.  Puede continuar teniendo relaciones sexuales, a menos que el mdico le indique lo contrario. Alivio del dolor y del malestar  Use un sostn que le brinde buen soporte si sus mamas estn sensibles.  Dese baos de asiento con agua tibia para aliviar el dolor o las molestias causadas por las hemorroides. Use una crema para las hemorroides si el mdico la autoriza.  Descanse con las piernas elevadas si tiene calambres o dolor de cintura.  Si desarrolla venas hinchadas y abultadas (vrices) en las piernas: ? Use medias de compresin o medias de descanso como se lo haya indicado el mdico. ? Levante (eleve) los pies durante 15minutos, 3 o 4veces por da. ? Limite el consumo de sal en sus alimentos. Cuidado prenatal  Escriba sus preguntas. Llvelas cuando concurra a las visitas prenatales.  Concurra a todas las visitas prenatales como se lo haya indicado el mdico. Esto es importante. Seguridad  Colquese el cinturn de seguridad cuando conduzca.  Haga una lista de los nmeros de telfono de emergencia, que incluya los nmeros de telfono de familiares, amigos, el hospital, as como los departamentos de polica y bomberos. Instrucciones generales  Consulte a su mdico sobre los alimentos que debe comer o pdale que la ayude a encontrar a quien pueda aconsejarla si necesita ese servicio.    Consulte a su mdico acerca de dnde se dictan clases prenatales cerca de donde vive. Comience las clases antes del mes 6 de embarazo.  No se d baos de inmersin en agua caliente, baos turcos ni saunas.  No se haga duchas vaginales ni use tampones o toallas higinicas perfumadas.   No mantenga las piernas cruzadas durante mucho tiempo.  Vaya al dentista si an no lo hizo. Use un cepillo de cerdas suaves para cepillarse los dientes. Psese el hilo dental suavemente.  No fume, no consuma hierbas ni beba alcohol. No tome frmacos que el mdico no haya autorizado.  No consuma ningn producto que contenga nicotina o tabaco, como cigarrillos y cigarrillos electrnicos. Si necesita ayuda para dejar de fumar, consulte al mdico.  Evite el contacto con las bandejas sanitarias de los gatos y la tierra que estos animales usan. Estos elementos contienen bacterias que pueden causar defectos congnitos al beb y la posible prdida del beb (aborto espontneo) o la muerte fetal. Comunquese con un mdico si:  Tiene clicos leves o siente presin en la parte baja del vientre.  Tiene dolor al hacer pis (orinar).  Advierte un lquido con olor ftido que proviene de la vagina.  Tiene malestar estomacal (nuseas), devuelve (vomita) o tiene deposiciones acuosas (diarrea).  Sufre un dolor persistente en el abdomen.  Siente mareos. Solicite ayuda de inmediato si:  Tiene fiebre.  Tiene una prdida de lquido por la vagina.  Tiene sangrado o pequeas prdidas vaginales.  Siente dolor intenso o clicos en el abdomen.  Sube o baja de peso rpidamente.  Tiene dificultades para recuperar el aliento y siente dolor en el pecho.  Sbitamente se le hinchan mucho el rostro, las manos, los tobillos, los pies o las piernas.  No ha sentido los movimientos del beb durante una hora.  Siente un dolor de cabeza intenso que no se alivia al tomar medicamentos.  Tiene dificultad para ver. Resumen  El segundo trimestre va desde la semana14 hasta la 27, desde el mes 4 hasta el 6. Este suele ser el momento en el que mejor se siente.  Para cuidarse y cuidar a su beb en gestacin, debe comer alimentos saludables, tomar medicamentos solamente si su mdico le indica que lo haga y hacer  actividades que sean seguras para usted y su beb.  Llame al mdico si se enferma o si nota algo inusual acerca de su embarazo. Tambin llame al mdico si necesita ayuda para saber qu alimentos debe comer o si quiere saber qu actividades puede realizar de forma segura. Esta informacin no tiene como fin reemplazar el consejo del mdico. Asegrese de hacerle al mdico cualquier pregunta que tenga. Document Released: 02/24/2013 Document Revised: 03/19/2017 Document Reviewed: 03/19/2017 Elsevier Patient Education  2020 Elsevier Inc.  

## 2019-05-04 NOTE — Progress Notes (Signed)
Subjective:  Yvette Avila is a 28 y.o. [id number] at [redacted]w[redacted]d being seen today for ongoing prenatal care.  She is currently monitored for the following issues for this low-risk pregnancy and has Supervision of other normal pregnancy, antepartum; H/O molar pregnancy, antepartum; and Alpha thalassemia silent carrier on their problem list.  Patient reports no complaints.   . Vag. Bleeding: None.  Movement: Absent. Denies leaking of fluid.   The following portions of the patient's history were reviewed and updated as appropriate: allergies, current medications, past family history, past medical history, past social history, past surgical history and problem list. Problem list updated.  Objective:   Vitals:   05/04/19 0819  BP: 116/73  Pulse: 84  Weight: 182 lb (82.6 kg)    Fetal Status: Fetal Heart Rate (bpm): 140   Movement: Absent     General:  Alert, oriented and cooperative. Patient is in no acute distress.  Skin: Skin is warm and dry. No rash noted.   Cardiovascular: Normal heart rate noted  Respiratory: Normal respiratory effort, no problems with respiration noted  Abdomen: Soft, gravid, appropriate for gestational age. Pain/Pressure: Absent     Pelvic:  Cervical exam deferred        Extremities: Normal range of motion.  Edema: None  Mental Status: Normal mood and affect. Normal behavior. Normal judgment and thought content.   Urinalysis:      Assessment and Plan:  Pregnancy: [id number] at [redacted]w[redacted]d  1. Supervision of other normal pregnancy, antepartum Stable Anatomy scanordered - AFP, Serum, Open Spina Bifida  2. H/O molar pregnancy, antepartum   3. Alpha thalassemia silent carrier Discussed with pt. Has received genetic counseling  Preterm labor symptoms and general obstetric precautions including but not limited to vaginal bleeding, contractions, leaking of fluid and fetal movement were reviewed in detail with the patient. Please refer to After Visit Summary for other  counseling recommendations.  Return in about 4 weeks (around 06/01/2019) for OB visit, face to face.   Chancy Milroy, MD

## 2019-05-06 LAB — AFP, SERUM, OPEN SPINA BIFIDA
AFP MoM: 1.01
AFP Value: 34 ng/mL
Gest. Age on Collection Date: 17 weeks
Maternal Age At EDD: 30.8 yr
OSBR Risk 1 IN: 10000
Test Results:: NEGATIVE
Weight: 182 [lb_av]

## 2019-06-01 ENCOUNTER — Encounter: Payer: Self-pay | Admitting: Obstetrics and Gynecology

## 2019-06-01 ENCOUNTER — Other Ambulatory Visit: Payer: Self-pay

## 2019-06-01 ENCOUNTER — Ambulatory Visit (INDEPENDENT_AMBULATORY_CARE_PROVIDER_SITE_OTHER): Payer: Self-pay | Admitting: Obstetrics and Gynecology

## 2019-06-01 VITALS — BP 104/67 | HR 86 | Wt 186.0 lb

## 2019-06-01 DIAGNOSIS — D563 Thalassemia minor: Secondary | ICD-10-CM

## 2019-06-01 DIAGNOSIS — O09A2 Supervision of pregnancy with history of molar pregnancy, second trimester: Secondary | ICD-10-CM

## 2019-06-01 DIAGNOSIS — O09A0 Supervision of pregnancy with history of molar pregnancy, unspecified trimester: Secondary | ICD-10-CM

## 2019-06-01 DIAGNOSIS — Z3A21 21 weeks gestation of pregnancy: Secondary | ICD-10-CM

## 2019-06-01 DIAGNOSIS — Z3480 Encounter for supervision of other normal pregnancy, unspecified trimester: Secondary | ICD-10-CM

## 2019-06-01 NOTE — Patient Instructions (Signed)

## 2019-06-01 NOTE — Progress Notes (Signed)
Subjective:  Yvette Avila is a 28 y.o. [id number] at [redacted]w[redacted]d being seen today for ongoing prenatal care.  She is currently monitored for the following issues for this low-risk pregnancy and has Supervision of other normal pregnancy, antepartum; H/O molar pregnancy, antepartum; and Alpha thalassemia silent carrier on their problem list.  Patient reports no complaints.  Contractions: Not present. Vag. Bleeding: None.  Movement: Present. Denies leaking of fluid.   The following portions of the patient's history were reviewed and updated as appropriate: allergies, current medications, past family history, past medical history, past social history, past surgical history and problem list. Problem list updated.  Objective:   Vitals:   06/01/19 0813  BP: 104/67  Pulse: 86  Weight: 186 lb (84.4 kg)    Fetal Status: Fetal Heart Rate (bpm): 149   Movement: Present     General:  Alert, oriented and cooperative. Patient is in no acute distress.  Skin: Skin is warm and dry. No rash noted.   Cardiovascular: Normal heart rate noted  Respiratory: Normal respiratory effort, no problems with respiration noted  Abdomen: Soft, gravid, appropriate for gestational age. Pain/Pressure: Absent     Pelvic:  Cervical exam deferred        Extremities: Normal range of motion.  Edema: None  Mental Status: Normal mood and affect. Normal behavior. Normal judgment and thought content.   Urinalysis:      Assessment and Plan:  Pregnancy: [id number] at [redacted]w[redacted]d  1. Supervision of other normal pregnancy, antepartum Stable Glucola next visit  2. Alpha thalassemia silent carrier S/P genetic counseling  3. H/O molar pregnancy, antepartum Stable  Preterm labor symptoms and general obstetric precautions including but not limited to vaginal bleeding, contractions, leaking of fluid and fetal movement were reviewed in detail with the patient. Please refer to After Visit Summary for other counseling recommendations.   Return in about 4 weeks (around 06/29/2019) for OB visit, fasting for Glucola.   Chancy Milroy, MD

## 2019-06-24 ENCOUNTER — Telehealth: Payer: Self-pay | Admitting: *Deleted

## 2019-06-24 NOTE — Telephone Encounter (Signed)
Spoke with [facility] regarding pt invoice acct. Made them aware that pt is non insured and she should be 100% indigent for this invoice (10/27 lab draw) Requisition with that noted to be faxed to [facility] today.

## 2019-06-29 ENCOUNTER — Other Ambulatory Visit: Payer: No Typology Code available for payment source

## 2019-06-29 ENCOUNTER — Encounter: Payer: No Typology Code available for payment source | Admitting: Obstetrics and Gynecology

## 2019-07-07 ENCOUNTER — Other Ambulatory Visit: Payer: No Typology Code available for payment source

## 2019-07-07 ENCOUNTER — Encounter: Payer: No Typology Code available for payment source | Admitting: Obstetrics and Gynecology

## 2019-07-09 NOTE — L&D Delivery Note (Addendum)
OB/GYN Faculty Practice Delivery Note  Yvette Avila is a 29 y.o. [id number] s/p VD at [redacted]w[redacted]d. She was admitted for SOL.   ROM: 0h 16m with clear fluid GBS Status:  Negative/-- (03/10 1054) Maximum Maternal Temperature: 97.9 F   Labor Progress: Patient presented to L&D for SOL. Initial SVE: 5/90/-2. Patient had AROM and then quickly delivered.  Delivery Date/Time: 10/11/19 at 1330  Delivery: Called to room and patient was complete and about to start pushing. Head delivered ROA with compound left hand presentation. No nuchal cord present. Shoulder and body delivered in usual fashion. Infant with spontaneous cry, placed on mother's abdomen, dried and stimulated. Cord clamped x 2 after 1-minute delay, and cut by FOB. Cord blood drawn. Placenta delivered spontaneously with gentle cord traction. Fundus firm with massage and Pitocin. Labia, perineum, vagina, and cervix inspected inspected with a first degree laceration repaired with 3.0 Vicryl in a standard fashion.  Baby Weight: pending   Placenta: Sent to L&D Complications: None Lacerations: 1st degree  EBL: 200 mL Analgesia: Epidural  Infant:  APGAR (1 MIN): 8   APGAR (5 MINS): 9   APGAR (10 MINS):     Katha Cabal, DO PGY-1, [facility] 10/11/2019 2:00 PM    OB FELLOW DELIVERY ATTESTATION  I was gloved and present for the delivery in its entirety, and I agree with the above resident's note.    Jerilynn Birkenhead, MD [facility] Fellow, [facility] for [employer], [facility]

## 2019-07-15 ENCOUNTER — Other Ambulatory Visit: Payer: No Typology Code available for payment source

## 2019-07-15 ENCOUNTER — Encounter: Payer: Self-pay | Admitting: Nurse Practitioner

## 2019-07-15 ENCOUNTER — Ambulatory Visit (INDEPENDENT_AMBULATORY_CARE_PROVIDER_SITE_OTHER): Payer: No Typology Code available for payment source | Admitting: Nurse Practitioner

## 2019-07-15 ENCOUNTER — Other Ambulatory Visit: Payer: Self-pay

## 2019-07-15 DIAGNOSIS — Z3482 Encounter for supervision of other normal pregnancy, second trimester: Secondary | ICD-10-CM

## 2019-07-15 DIAGNOSIS — Z3480 Encounter for supervision of other normal pregnancy, unspecified trimester: Secondary | ICD-10-CM

## 2019-07-15 DIAGNOSIS — Z3A27 27 weeks gestation of pregnancy: Secondary | ICD-10-CM

## 2019-07-15 DIAGNOSIS — Z23 Encounter for immunization: Secondary | ICD-10-CM

## 2019-07-15 NOTE — Progress Notes (Signed)
    Subjective:  Yvette Avila is a 29 y.o. [id number] at [redacted]w[redacted]d being seen today for ongoing prenatal care.  She is currently monitored for the following issues for this low-risk pregnancy and has Supervision of other normal pregnancy, antepartum; H/O molar pregnancy, antepartum; and Alpha thalassemia silent carrier on their problem list. Client speaks English well.    Patient reports lower abdominal pain especially at night.  Contractions: Not present. Vag. Bleeding: None.  Movement: Present. Denies leaking of fluid.   The following portions of the patient's history were reviewed and updated as appropriate: allergies, current medications, past family history, past medical history, past social history, past surgical history and problem list. Problem list updated.  Objective:   Vitals:   07/15/19 0821  BP: 109/72  Pulse: 74  Weight: 189 lb (85.7 kg)    Fetal Status: Fetal Heart Rate (bpm): 140 Fundal Height: 30 cm Movement: Present     General:  Alert, oriented and cooperative. Patient is in no acute distress.  Skin: Skin is warm and dry. No rash noted.   Cardiovascular: Normal heart rate noted  Respiratory: Normal respiratory effort, no problems with respiration noted  Abdomen: Soft, gravid, appropriate for gestational age. Pain/Pressure: Present     Pelvic:  Cervical exam deferred        Extremities: Normal range of motion.  Edema: Trace  Mental Status: Normal mood and affect. Normal behavior. Normal judgment and thought content.   Urinalysis:      Assessment and Plan:  Pregnancy: [id number] at [redacted]w[redacted]d  1. Supervision of other normal pregnancy, antepartum Has BP cuff and reminded to check BP weekly Works at [employer]  2.  Lower Abdominal pain and back pain Maternity support belt - no insurance Drink at least 8 8-oz glasses of water every day. Back stretching exercises  Preterm labor symptoms and general obstetric precautions including but not limited to  vaginal bleeding, contractions, leaking of fluid and fetal movement were reviewed in detail with the patient. Please refer to After Visit Summary for other counseling recommendations.  Return in about 2 weeks (around 07/29/2019) for virtual ROB.  Nolene Bernheim, RN, MSN, NP-BC Nurse Practitioner, [facility] for [employer], [facility] 07/15/2019 9:02 AM

## 2019-07-16 LAB — GLUCOSE TOLERANCE, 2 HOURS W/ 1HR
Glucose, 1 hour: 174 mg/dL (ref 65–179)
Glucose, 2 hour: 147 mg/dL (ref 65–152)
Glucose, Fasting: 89 mg/dL (ref 65–91)

## 2019-07-16 LAB — CBC
Hematocrit: 33.3 % — ABNORMAL LOW (ref 34.0–46.6)
Hemoglobin: 10.8 g/dL — ABNORMAL LOW (ref 11.1–15.9)
MCH: 26.4 pg — ABNORMAL LOW (ref 26.6–33.0)
MCHC: 32.4 g/dL (ref 31.5–35.7)
MCV: 81 fL (ref 79–97)
Platelets: 318 10*3/uL (ref 150–450)
RBC: 4.09 x10E6/uL (ref 3.77–5.28)
RDW: 13.8 % (ref 11.7–15.4)
WBC: 10.1 10*3/uL (ref 3.4–10.8)

## 2019-07-16 LAB — RPR: RPR Ser Ql: NONREACTIVE

## 2019-07-16 LAB — HIV ANTIBODY (ROUTINE TESTING W REFLEX): HIV Screen 4th Generation wRfx: NONREACTIVE

## 2019-07-17 LAB — URINE CULTURE: Organism ID, Bacteria: NO GROWTH

## 2019-07-21 ENCOUNTER — Other Ambulatory Visit (INDEPENDENT_AMBULATORY_CARE_PROVIDER_SITE_OTHER): Payer: Self-pay

## 2019-07-21 DIAGNOSIS — B349 Viral infection, unspecified: Secondary | ICD-10-CM

## 2019-07-22 LAB — NOVEL CORONAVIRUS, NAA: SARS-CoV-2, NAA: NOT DETECTED

## 2019-07-29 ENCOUNTER — Telehealth (INDEPENDENT_AMBULATORY_CARE_PROVIDER_SITE_OTHER): Payer: No Typology Code available for payment source | Admitting: Obstetrics and Gynecology

## 2019-07-29 ENCOUNTER — Encounter: Payer: Self-pay | Admitting: Obstetrics and Gynecology

## 2019-07-29 DIAGNOSIS — Z3A29 29 weeks gestation of pregnancy: Secondary | ICD-10-CM

## 2019-07-29 DIAGNOSIS — Z3480 Encounter for supervision of other normal pregnancy, unspecified trimester: Secondary | ICD-10-CM

## 2019-07-29 DIAGNOSIS — D563 Thalassemia minor: Secondary | ICD-10-CM

## 2019-07-29 DIAGNOSIS — O99113 Other diseases of the blood and blood-forming organs and certain disorders involving the immune mechanism complicating pregnancy, third trimester: Secondary | ICD-10-CM

## 2019-07-29 NOTE — Progress Notes (Signed)
   TELEHEALTH OBSTETRICS PRENATAL VIRTUAL VIDEO VISIT ENCOUNTER NOTE  Provider location: [facility] for [facility] at [place]   I connected with Yvette Avila on 07/29/19 at  8:15 AM EST by MyChart Video Encounter at home and verified that I am speaking with the correct person using two identifiers.   I discussed the limitations, risks, security and privacy concerns of performing an evaluation and management service virtually and the availability of in person appointments. I also discussed with the patient that there may be a patient responsible charge related to this service. The patient expressed understanding and agreed to proceed. Subjective:  Yvette Avila is a 29 y.o. [id number] at [redacted]w[redacted]d being seen today for ongoing prenatal care.  She is currently monitored for the following issues for this low-risk pregnancy and has Supervision of other normal pregnancy, antepartum; H/O molar pregnancy, antepartum; and Alpha thalassemia silent carrier on their problem list.  Patient reports no complaints.  Contractions: Not present. Vag. Bleeding: None.  Movement: Present. Denies any leaking of fluid.   The following portions of the patient's history were reviewed and updated as appropriate: allergies, current medications, past family history, past medical history, past social history, past surgical history and problem list.   Objective:  There were no vitals filed for this visit.  Fetal Status:     Movement: Present     General:  Alert, oriented and cooperative. Patient is in no acute distress.  Respiratory: Normal respiratory effort, no problems with respiration noted  Mental Status: Normal mood and affect. Normal behavior. Normal judgment and thought content.  Rest of physical exam deferred due to type of encounter  Imaging: No results found.  Assessment and Plan:  Pregnancy: [id number] at [redacted]w[redacted]d  1. Supervision of other normal pregnancy, antepartum Pt interested in  tubal/vasectomy but does not have insurance, reviewed these as well as LARCs that can be obtained at [facility] for lower cost, she will consider  2. Alpha thalassemia silent carrier S/p genetic counseling  Preterm labor symptoms and general obstetric precautions including but not limited to vaginal bleeding, contractions, leaking of fluid and fetal movement were reviewed in detail with the patient. I discussed the assessment and treatment plan with the patient. The patient was provided an opportunity to ask questions and all were answered. The patient agreed with the plan and demonstrated an understanding of the instructions. The patient was advised to call back or seek an in-person office evaluation/go to MAU at [facility] for any urgent or concerning symptoms. Please refer to After Visit Summary for other counseling recommendations.   I provided 20 minutes of face-to-face time during this encounter.  Return in about 2 weeks (around 08/12/2019) for low OB, virtual.  No future appointments.  Conan Bowens, MD Center for [facility], [facility]

## 2019-07-29 NOTE — Progress Notes (Signed)
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  CC: NONE

## 2019-08-12 ENCOUNTER — Telehealth: Payer: No Typology Code available for payment source | Admitting: Advanced Practice Midwife

## 2019-08-31 ENCOUNTER — Telehealth (INDEPENDENT_AMBULATORY_CARE_PROVIDER_SITE_OTHER): Payer: No Typology Code available for payment source | Admitting: Obstetrics and Gynecology

## 2019-08-31 ENCOUNTER — Encounter: Payer: Self-pay | Admitting: Obstetrics and Gynecology

## 2019-08-31 DIAGNOSIS — Z3A34 34 weeks gestation of pregnancy: Secondary | ICD-10-CM

## 2019-08-31 DIAGNOSIS — Z3483 Encounter for supervision of other normal pregnancy, third trimester: Secondary | ICD-10-CM

## 2019-08-31 DIAGNOSIS — Z3480 Encounter for supervision of other normal pregnancy, unspecified trimester: Secondary | ICD-10-CM

## 2019-08-31 MED ORDER — BLOOD PRESSURE KIT DEVI: 1.0000 | 0 refills | Status: AC | PRN

## 2019-08-31 NOTE — Patient Instructions (Signed)

## 2019-08-31 NOTE — Progress Notes (Signed)
   MY CHART VIDEO VIRTUAL OBSTETRICS VISIT ENCOUNTER NOTE  I connected with Shequilla Alfaro-Ruiz on 08/31/19 at  4:00 PM EST by My Chart video at home and verified that I am speaking with the correct person using two identifiers.   I discussed the limitations, risks, security and privacy concerns of performing an evaluation and management service by My Chart video and the availability of in person appointments. I also discussed with the patient that there may be a patient responsible charge related to this service. The patient expressed understanding and agreed to proceed.  Subjective:  Yvette Avila is a 29 y.o. [id number] at 69w2dbeing followed for ongoing prenatal care.  She is currently monitored for the following issues for this low-risk pregnancy and has Supervision of other normal pregnancy, antepartum; H/O molar pregnancy, antepartum; and Alpha thalassemia silent carrier on their problem list.  Patient reports occasional contractions and some pelvic pressure. Reports fetal movement. Denies any contractions, bleeding or leaking of fluid.   The following portions of the patient's history were reviewed and updated as appropriate: allergies, current medications, past family history, past medical history, past social history, past surgical history and problem list.   Objective:   General:  Alert, oriented and cooperative.   Mental Status: Normal mood and affect perceived. Normal judgment and thought content.  Rest of physical exam deferred due to type of encounter  LMP 01/03/2019   **VS not done d/t patient not having BP cuff or scale at home and she was driving during visit.  Assessment and Plan:  Pregnancy: [id number] 358w2d1. Supervision of other normal pregnancy, antepartum - Blood Pressure Monitoring (BLOOD PRESSURE KIT) DEVI; 1 Device by Does not apply route as needed.  Dispense: 1 each; Refill: 0 - Discussed GBS and cervical exam in office in 2 wks  Preterm labor  symptoms and general obstetric precautions including but not limited to vaginal bleeding, contractions, leaking of fluid and fetal movement were reviewed in detail with the patient.  I discussed the assessment and treatment plan with the patient. The patient was provided an opportunity to ask questions and all were answered. The patient agreed with the plan and demonstrated an understanding of the instructions. The patient was advised to call back or seek an in-person office evaluation/[facility] any urgent or concerning symptoms. Please refer to After Visit Summary for other counseling recommendations.   I provided 5 minutes of non-face-to-face time during this encounter. There was 5 minutes of chart review time spent prior to this encounter. Total time spent = 10 minutes.  Return in about 2 weeks (around 09/14/2019) for Return OB w/GBS.  Future Appointments  Date Time Provider [place]2/23/2021  4:00 PM DaLaury DeepCNM [facility] [place]    RoLaury DeepCNDwightor [facility]

## 2019-08-31 NOTE — Progress Notes (Signed)
Virtual Visit via Telephone Note  I connected with Yvette Avila on 08/31/19 at  4:00 PM EST by telephone and verified that I am speaking with the correct person using two identifiers.  Pt c/o vaginal pressure and BH's.  Denies LOF/VB Does not have BP cuff

## 2019-09-15 ENCOUNTER — Other Ambulatory Visit: Payer: Self-pay

## 2019-09-15 ENCOUNTER — Ambulatory Visit (INDEPENDENT_AMBULATORY_CARE_PROVIDER_SITE_OTHER): Payer: Self-pay | Admitting: Family Medicine

## 2019-09-15 VITALS — BP 106/71 | HR 79 | Wt 198.0 lb

## 2019-09-15 DIAGNOSIS — Z3A36 36 weeks gestation of pregnancy: Secondary | ICD-10-CM

## 2019-09-15 DIAGNOSIS — Z3480 Encounter for supervision of other normal pregnancy, unspecified trimester: Secondary | ICD-10-CM

## 2019-09-15 DIAGNOSIS — Z113 Encounter for screening for infections with a predominantly sexual mode of transmission: Secondary | ICD-10-CM

## 2019-09-15 NOTE — Progress Notes (Signed)
   PRENATAL VISIT NOTE  Subjective:  Yvette Avila is a 29 y.o. V7B9390 at [redacted]w[redacted]d being seen today for ongoing prenatal care.  She is currently monitored for the following issues for this low-risk pregnancy and has Supervision of other normal pregnancy, antepartum; H/O molar pregnancy, antepartum; and Alpha thalassemia silent carrier on their problem list.  Patient reports feeling tired but otherwise no complaints.  Contractions: Irregular. Vag. Bleeding: None.  Movement: Present. Denies leaking of fluid.   The following portions of the patient's history were reviewed and updated as appropriate: allergies, current medications, past family history, past medical history, past social history, past surgical history and problem list.   Objective:   Vitals:   09/15/19 1034  BP: 106/71  Pulse: 79  Weight: 198 lb (89.8 kg)    Fetal Status: Fetal Heart Rate (bpm): 150 Fundal Height: 36 cm Movement: Present  Presentation: Vertex  General:  Alert, oriented and cooperative. Patient is in no acute distress.  Skin: Skin is warm and dry. No rash noted.   Cardiovascular: Normal heart rate noted  Respiratory: Normal respiratory effort, no problems with respiration noted  Abdomen: Soft, gravid, appropriate for gestational age.  Pain/Pressure: Present     Pelvic: Cervical exam performed Dilation: 2 Effacement (%): 50 Station: -3  Extremities: Normal range of motion.  Edema: Trace  Mental Status: Normal mood and affect. Normal behavior. Normal judgment and thought content.   Assessment and Plan:  Pregnancy: [id number] at [redacted]w[redacted]d 1. Supervision of other normal pregnancy, antepartum - RTC in 1 week; patient owns BP cuff and prefers virtual - girl, breast, undecided  - Strep Gp B NAA - Cervicovaginal ancillary only( [place])  Preterm labor symptoms and general obstetric precautions including but not limited to vaginal bleeding, contractions, leaking of fluid and fetal movement were reviewed in  detail with the patient. Please refer to After Visit Summary for other counseling recommendations.   Return in about 1 week (around 09/22/2019) for LOB; virtual okay if has BP cuff .  No future appointments.  Joselyn Arrow, MD

## 2019-09-15 NOTE — Progress Notes (Signed)
ROB GBS 

## 2019-09-16 LAB — CERVICOVAGINAL ANCILLARY ONLY
Chlamydia: NEGATIVE
Comment: NEGATIVE
Comment: NORMAL
Neisseria Gonorrhea: NEGATIVE

## 2019-09-17 LAB — STREP GP B NAA: Strep Gp B NAA: NEGATIVE

## 2019-09-22 ENCOUNTER — Encounter: Payer: Self-pay | Admitting: Obstetrics

## 2019-09-22 ENCOUNTER — Telehealth (INDEPENDENT_AMBULATORY_CARE_PROVIDER_SITE_OTHER): Payer: No Typology Code available for payment source | Admitting: Obstetrics

## 2019-09-22 VITALS — BP 110/70 | HR 81

## 2019-09-22 DIAGNOSIS — Z3480 Encounter for supervision of other normal pregnancy, unspecified trimester: Secondary | ICD-10-CM

## 2019-09-22 DIAGNOSIS — D563 Thalassemia minor: Secondary | ICD-10-CM

## 2019-09-22 DIAGNOSIS — O99013 Anemia complicating pregnancy, third trimester: Secondary | ICD-10-CM

## 2019-09-22 DIAGNOSIS — Z3A37 37 weeks gestation of pregnancy: Secondary | ICD-10-CM

## 2019-09-22 NOTE — Progress Notes (Signed)
S/w pt for virtual visit. Pt reports fetal movement and irregular contractions. 

## 2019-09-22 NOTE — Progress Notes (Addendum)
   TELEHEALTH OBSTETRICS PRENATAL VIRTUAL VIDEO VISIT ENCOUNTER NOTE  Provider location: [facility] for [facility] at [place]   I connected with [name] on 09/22/19 at  9:00 AM EDT by OB MyChart Video Encounter at home and verified that I am speaking with the correct person using two identifiers.   I discussed the limitations, risks, security and privacy concerns of performing an evaluation and management service virtually and the availability of in person appointments. I also discussed with the patient that there may be a patient responsible charge related to this service. The patient expressed understanding and agreed to proceed. Subjective:  Yvette Avila is a 29 y.o. [id number] at [redacted]w[redacted]d being seen today for ongoing prenatal care.  She is currently monitored for the following issues for this low-risk pregnancy and has Supervision of other normal pregnancy, antepartum; H/O molar pregnancy, antepartum; and Alpha thalassemia silent carrier on their problem list.  Patient reports no complaints.  Contractions: Irregular. Vag. Bleeding: None.  Movement: Present. Denies any leaking of fluid.   The following portions of the patient's history were reviewed and updated as appropriate: allergies, current medications, past family history, past medical history, past social history, past surgical history and problem list.   Objective:   Vitals:   09/22/19 0836  BP: 110/70  Pulse: 81    Fetal Status:     Movement: Present     General:  Alert, oriented and cooperative. Patient is in no acute distress.  Respiratory: Normal respiratory effort, no problems with respiration noted  Mental Status: Normal mood and affect. Normal behavior. Normal judgment and thought content.  Rest of physical exam deferred due to type of encounter  Imaging: No results found.  Assessment and Plan:  Pregnancy: H4V4259 at [redacted]w[redacted]d 1. Supervision of other normal pregnancy, antepartum  2. Alpha  thalassemia silent carrier   Term labor symptoms and general obstetric precautions including but not limited to vaginal bleeding, contractions, leaking of fluid and fetal movement were reviewed in detail with the patient. I discussed the assessment and treatment plan with the patient. The patient was provided an opportunity to ask questions and all were answered. The patient agreed with the plan and demonstrated an understanding of the instructions. The patient was advised to call back or seek an in-person office evaluation/go to MAU at [facility] for any urgent or concerning symptoms. Please refer to After Visit Summary for other counseling recommendations.   I provided 10 minutes of face-to-face time during this encounter.  Return in about 1 week (around 09/29/2019) for MyChart.  Future Appointments  Date Time Provider Department Center  09/22/2019  9:00 AM Brock Bad, MD [facility] None    Coral Ceo, MD Center for [facility], [facility] 09/22/2019

## 2019-09-28 ENCOUNTER — Encounter: Payer: Self-pay | Admitting: Obstetrics

## 2019-09-28 ENCOUNTER — Telehealth (INDEPENDENT_AMBULATORY_CARE_PROVIDER_SITE_OTHER): Payer: No Typology Code available for payment source | Admitting: Obstetrics

## 2019-09-28 VITALS — BP 112/74 | HR 80

## 2019-09-28 DIAGNOSIS — Z3480 Encounter for supervision of other normal pregnancy, unspecified trimester: Secondary | ICD-10-CM

## 2019-09-28 DIAGNOSIS — Z3A38 38 weeks gestation of pregnancy: Secondary | ICD-10-CM

## 2019-09-28 DIAGNOSIS — Z3483 Encounter for supervision of other normal pregnancy, third trimester: Secondary | ICD-10-CM

## 2019-09-28 NOTE — Progress Notes (Signed)
   OBSTETRICS PRENATAL VIRTUAL VISIT ENCOUNTER NOTE  Provider location: Center for [facility] at [place]   I connected with Yvette Avila on 09/28/19 at  9:15 AM EDT by MyChart Video Encounter at home and verified that I am speaking with the correct person using two identifiers.   I discussed the limitations, risks, security and privacy concerns of performing an evaluation and management service virtually and the availability of in person appointments. I also discussed with the patient that there may be a patient responsible charge related to this service. The patient expressed understanding and agreed to proceed. Subjective:  Yvette Avila is a 29 y.o. J1H4174 at [redacted]w[redacted]d being seen today for ongoing prenatal care.  She is currently monitored for the following issues for this low-risk pregnancy and has Supervision of other normal pregnancy, antepartum; H/O molar pregnancy, antepartum; and Alpha thalassemia silent carrier on their problem list.  Patient reports no complaints.  Contractions: Irregular. Vag. Bleeding: None.  Movement: Present. Denies any leaking of fluid.   The following portions of the patient's history were reviewed and updated as appropriate: allergies, current medications, past family history, past medical history, past social history, past surgical history and problem list.   Objective:   Vitals:   09/28/19 0913  BP: 112/74  Pulse: 80    Fetal Status:     Movement: Present     General:  Alert, oriented and cooperative. Patient is in no acute distress.  Respiratory: Normal respiratory effort, no problems with respiration noted  Mental Status: Normal mood and affect. Normal behavior. Normal judgment and thought content.  Rest of physical exam deferred due to type of encounter  Imaging: No results found.  Assessment and Plan:  Pregnancy: [id number] at [redacted]w[redacted]d 1. Supervision of other normal pregnancy, antepartum   Term labor symptoms and general  obstetric precautions including but not limited to vaginal bleeding, contractions, leaking of fluid and fetal movement were reviewed in detail with the patient. I discussed the assessment and treatment plan with the patient. The patient was provided an opportunity to ask questions and all were answered. The patient agreed with the plan and demonstrated an understanding of the instructions. The patient was advised to call back or seek an in-person office evaluation/go to [facility] at [facility] for any urgent or concerning symptoms. Please refer to After Visit Summary for other counseling recommendations.   I provided 10 minutes of face-to-face time during this encounter.  Return in about 1 week (around 10/05/2019) for ROB.   Coral Ceo, MD Center for [facility], [facility] 09/28/2019

## 2019-10-05 ENCOUNTER — Other Ambulatory Visit: Payer: Self-pay

## 2019-10-05 ENCOUNTER — Telehealth (HOSPITAL_COMMUNITY): Payer: Self-pay | Admitting: *Deleted

## 2019-10-05 ENCOUNTER — Ambulatory Visit (INDEPENDENT_AMBULATORY_CARE_PROVIDER_SITE_OTHER): Payer: No Typology Code available for payment source | Admitting: Obstetrics & Gynecology

## 2019-10-05 ENCOUNTER — Encounter (HOSPITAL_COMMUNITY): Payer: Self-pay | Admitting: *Deleted

## 2019-10-05 VITALS — BP 122/78 | HR 84 | Wt 198.0 lb

## 2019-10-05 DIAGNOSIS — Z3480 Encounter for supervision of other normal pregnancy, unspecified trimester: Secondary | ICD-10-CM

## 2019-10-05 DIAGNOSIS — Z3483 Encounter for supervision of other normal pregnancy, third trimester: Secondary | ICD-10-CM

## 2019-10-05 DIAGNOSIS — Z3A39 39 weeks gestation of pregnancy: Secondary | ICD-10-CM

## 2019-10-05 NOTE — Telephone Encounter (Signed)
Preadmission screen  

## 2019-10-05 NOTE — Patient Instructions (Signed)

## 2019-10-05 NOTE — Progress Notes (Signed)
   PRENATAL VISIT NOTE  Subjective:  Yvette Avila is a 29 y.o. [id number] at [redacted]w[redacted]d being seen today for ongoing prenatal care.  She is currently monitored for the following issues for this low-risk pregnancy and has Supervision of other normal pregnancy, antepartum; H/O molar pregnancy, antepartum; and Alpha thalassemia silent carrier on their problem list.  Patient reports no complaints.  Contractions: Irregular. Vag. Bleeding: None.  Movement: Present. Denies leaking of fluid.   The following portions of the patient's history were reviewed and updated as appropriate: allergies, current medications, past family history, past medical history, past social history, past surgical history and problem list.   Objective:   Vitals:   10/05/19 0814  BP: 122/78  Pulse: 84  Weight: 198 lb (89.8 kg)    Fetal Status: Fetal Heart Rate (bpm): 140   Movement: Present     General:  Alert, oriented and cooperative. Patient is in no acute distress.  Skin: Skin is warm and dry. No rash noted.   Cardiovascular: Normal heart rate noted  Respiratory: Normal respiratory effort, no problems with respiration noted  Abdomen: Soft, gravid, appropriate for gestational age.  Pain/Pressure: Present     Pelvic: Cervical exam performed in the presence of a chaperone        Extremities: Normal range of motion.  Edema: Trace  Mental Status: Normal mood and affect. Normal behavior. Normal judgment and thought content.   Assessment and Plan:  Pregnancy: [id number] at [redacted]w[redacted]d 1. Supervision of other normal pregnancy, antepartum IOL 41 weeks prn  Term labor symptoms and general obstetric precautions including but not limited to vaginal bleeding, contractions, leaking of fluid and fetal movement were reviewed in detail with the patient. Please refer to After Visit Summary for other counseling recommendations.   Return in about 1 week (around 10/12/2019) for NST.  No future appointments.  Scheryl Darter, MD

## 2019-10-11 ENCOUNTER — Other Ambulatory Visit: Payer: Self-pay

## 2019-10-11 ENCOUNTER — Inpatient Hospital Stay (HOSPITAL_COMMUNITY)
Admission: AD | Admit: 2019-10-11 | Discharge: 2019-10-12 | DRG: 807 | Disposition: A | Discharge status: Home / Self Care | Payer: Medicaid Other | Attending: Family Medicine | Admitting: Family Medicine

## 2019-10-11 ENCOUNTER — Encounter (HOSPITAL_COMMUNITY): Payer: Self-pay | Admitting: Obstetrics and Gynecology

## 2019-10-11 DIAGNOSIS — O322XX0 Maternal care for transverse and oblique lie, not applicable or unspecified: Principal | ICD-10-CM | POA: Diagnosis present

## 2019-10-11 DIAGNOSIS — Z3A40 40 weeks gestation of pregnancy: Secondary | ICD-10-CM

## 2019-10-11 DIAGNOSIS — Z87891 Personal history of nicotine dependence: Secondary | ICD-10-CM | POA: Diagnosis not present

## 2019-10-11 DIAGNOSIS — O26893 Other specified pregnancy related conditions, third trimester: Secondary | ICD-10-CM | POA: Diagnosis present

## 2019-10-11 DIAGNOSIS — D563 Thalassemia minor: Secondary | ICD-10-CM | POA: Diagnosis present

## 2019-10-11 DIAGNOSIS — Z20822 Contact with and (suspected) exposure to covid-19: Secondary | ICD-10-CM | POA: Diagnosis present

## 2019-10-11 LAB — ABO/RH: ABO/RH(D): O POS

## 2019-10-11 LAB — CBC
HCT: 38 % (ref 36.0–46.0)
Hemoglobin: 11.9 g/dL — ABNORMAL LOW (ref 12.0–15.0)
MCH: 26 pg (ref 26.0–34.0)
MCHC: 31.3 g/dL (ref 30.0–36.0)
MCV: 83 fL (ref 80.0–100.0)
Platelets: 288 10*3/uL (ref 150–400)
RBC: 4.58 MIL/uL (ref 3.87–5.11)
RDW: 16.3 % — ABNORMAL HIGH (ref 11.5–15.5)
WBC: 9.2 10*3/uL (ref 4.0–10.5)
nRBC: 0 % (ref 0.0–0.2)

## 2019-10-11 LAB — RESPIRATORY PANEL BY RT PCR (FLU A&B, COVID)
Influenza A by PCR: NEGATIVE
Influenza B by PCR: NEGATIVE
SARS Coronavirus 2 by RT PCR: NEGATIVE

## 2019-10-11 LAB — TYPE AND SCREEN
ABO/RH(D): O POS
Antibody Screen: NEGATIVE

## 2019-10-11 MED ORDER — COCONUT OIL OIL: 1.0000 "application " | TOPICAL_OIL | Status: DC | PRN

## 2019-10-11 MED ORDER — BENZOCAINE-MENTHOL 20-0.5 % EX AERO: 1.0000 "application " | INHALATION_SPRAY | CUTANEOUS | Status: DC | PRN

## 2019-10-11 MED ORDER — DIPHENHYDRAMINE HCL 25 MG PO CAPS: 25.0000 mg | ORAL_CAPSULE | Freq: Four times a day (QID) | ORAL | Status: DC | PRN

## 2019-10-11 MED ORDER — OXYTOCIN 40 UNITS IN NORMAL SALINE INFUSION - SIMPLE MED
INTRAVENOUS | Status: AC
  Filled 2019-10-11: qty 1000

## 2019-10-11 MED ORDER — FENTANYL CITRATE (PF) 100 MCG/2ML IJ SOLN: 50.0000 ug | INTRAMUSCULAR | Status: DC | PRN

## 2019-10-11 MED ORDER — SOD CITRATE-CITRIC ACID 500-334 MG/5ML PO SOLN: 30.0000 mL | ORAL | Status: DC | PRN

## 2019-10-11 MED ORDER — SIMETHICONE 80 MG PO CHEW: 80.0000 mg | CHEWABLE_TABLET | ORAL | Status: DC | PRN

## 2019-10-11 MED ORDER — LACTATED RINGERS IV SOLN: 500.0000 mL | INTRAVENOUS | Status: DC | PRN

## 2019-10-11 MED ORDER — IBUPROFEN 600 MG PO TABS
600.0000 mg | ORAL_TABLET | Freq: Three times a day (TID) | ORAL | Status: DC | PRN
  Administered 2019-10-11 – 2019-10-12 (×3): 600 mg via ORAL
  Filled 2019-10-11 (×3): qty 1

## 2019-10-11 MED ORDER — ACETAMINOPHEN 325 MG PO TABS: 650.0000 mg | ORAL_TABLET | ORAL | Status: DC | PRN

## 2019-10-11 MED ORDER — SENNOSIDES-DOCUSATE SODIUM 8.6-50 MG PO TABS
2.0000 | ORAL_TABLET | ORAL | Status: DC
  Administered 2019-10-12: 2 via ORAL
  Filled 2019-10-11: qty 2

## 2019-10-11 MED ORDER — OXYTOCIN BOLUS FROM INFUSION
500.0000 mL | Freq: Once | INTRAVENOUS | Status: AC
  Administered 2019-10-11: 500 mL via INTRAVENOUS

## 2019-10-11 MED ORDER — ONDANSETRON HCL 4 MG PO TABS: 4.0000 mg | ORAL_TABLET | ORAL | Status: DC | PRN

## 2019-10-11 MED ORDER — ACETAMINOPHEN 325 MG PO TABS
650.0000 mg | ORAL_TABLET | Freq: Four times a day (QID) | ORAL | Status: DC | PRN
  Administered 2019-10-11 – 2019-10-12 (×3): 650 mg via ORAL
  Filled 2019-10-11 (×3): qty 2

## 2019-10-11 MED ORDER — OXYTOCIN 40 UNITS IN NORMAL SALINE INFUSION - SIMPLE MED: 2.5000 [IU]/h | INTRAVENOUS | Status: DC

## 2019-10-11 MED ORDER — ONDANSETRON HCL 4 MG/2ML IJ SOLN: 4.0000 mg | INTRAMUSCULAR | Status: DC | PRN

## 2019-10-11 MED ORDER — DIBUCAINE (PERIANAL) 1 % EX OINT: 1.0000 "application " | TOPICAL_OINTMENT | CUTANEOUS | Status: DC | PRN

## 2019-10-11 MED ORDER — ONDANSETRON HCL 4 MG/2ML IJ SOLN: 4.0000 mg | Freq: Four times a day (QID) | INTRAMUSCULAR | Status: DC | PRN

## 2019-10-11 MED ORDER — WITCH HAZEL-GLYCERIN EX PADS: 1.0000 "application " | MEDICATED_PAD | CUTANEOUS | Status: DC | PRN

## 2019-10-11 MED ORDER — PRENATAL MULTIVITAMIN CH
1.0000 | ORAL_TABLET | Freq: Every day | ORAL | Status: DC
  Administered 2019-10-12: 1 via ORAL
  Filled 2019-10-11: qty 1

## 2019-10-11 MED ORDER — TETANUS-DIPHTH-ACELL PERTUSSIS 5-2.5-18.5 LF-MCG/0.5 IM SUSP: 0.5000 mL | Freq: Once | INTRAMUSCULAR | Status: DC

## 2019-10-11 MED ORDER — MEASLES, MUMPS & RUBELLA VAC IJ SOLR: 0.5000 mL | Freq: Once | INTRAMUSCULAR | Status: DC

## 2019-10-11 MED ORDER — LACTATED RINGERS IV SOLN: INTRAVENOUS | Status: DC

## 2019-10-11 MED ORDER — LIDOCAINE HCL (PF) 1 % IJ SOLN
30.0000 mL | INTRAMUSCULAR | Status: AC | PRN
  Administered 2019-10-11: 30 mL via SUBCUTANEOUS
  Filled 2019-10-11: qty 30

## 2019-10-11 NOTE — Discharge Summary (Signed)
Postpartum Discharge Summary      Patient Name: Yvette Avila DOB: Jul 09, 1990 MRN: [id number]  Date of admission: 10/11/2019 Delivering Provider: Chauncey Mann   Date of discharge: 10/12/2019  Admitting diagnosis: Indication for care in labor and delivery, antepartum [O75.9] Intrauterine pregnancy: [redacted]w[redacted]d    Secondary diagnosis:  Active Problems:   Alpha thalassemia silent carrier   Labor and delivery, indication for care   [redacted] weeks gestation of pregnancy  Additional problems: None     Discharge diagnosis: Term Pregnancy Delivered                                                                                                Post partum procedures:None  Augmentation: AROM  Complications: None  Hospital course:  Onset of Labor With Vaginal Delivery     29y.o. yo [id number] 420w1das admitted in Latent Labor on 10/11/2019. Patient had an uncomplicated labor course as follows: Initial SVE 5/90/-1. Patient progressed to 9cm and AROM performed and delivered minutes thereafter.  Membrane Rupture Time/Date: 1:27 PM ,10/11/2019   Intrapartum Procedures: Episiotomy: None [1]                                         Lacerations:  1st degree [2]  Patient had a delivery of a Viable infant. 10/11/2019  Information for the patient's newborn:  AlLakiyah, Arntson0[[id number]]Delivery Method: Vaginal, Spontaneous(Filed from Delivery Summary)     Pateint had an uncomplicated postpartum course.  She is ambulating, tolerating a regular diet, passing flatus, and urinating well. Patient is discharged home in stable condition on 10/12/19.  Delivery time: 1:30 PM    Magnesium Sulfate received: No BMZ received: No Rhophylac:No MMR:No Transfusion:No  Physical exam  Vitals:   10/11/19 1640 10/11/19 2113 10/12/19 0028 10/12/19 0412  BP: 102/65 112/68 108/63 107/66  Pulse: 75 70 (!) 57 70  Resp: _0 Temp: 98.4 F (36.9 C) 98.5 F (36.9 C) 98.5 F (36.9 C) 98.6 F  (37 C)  TempSrc: Oral Oral Oral Oral  SpO2: 100%     Weight:      Height:       General: alert, cooperative and no distress Lochia: appropriate Uterine Fundus: firm Incision: N/A DVT Evaluation: No evidence of DVT seen on physical exam. Labs: Lab Results  Component Value Date   WBC 9.2 10/11/2019   HGB 11.9 (L) 10/11/2019   HCT 38.0 10/11/2019   MCV 83.0 10/11/2019   PLT 288 10/11/2019   CMP Latest Ref Rng & Units 08/14/2015  Glucose 65 - 99 mg/dL 87  BUN 6 - 20 mg/dL 9  Creatinine 0.57 - 1.00 mg/dL 0.72  Sodium 134 - 144 mmol/L 137  Potassium 3.5 - 5.2 mmol/L 4.3  Chloride 96 - 106 mmol/L 102  CO2 18 - 29 mmol/L 21  Calcium 8.7 - 10.2 mg/dL 9.1  Total Protein 6.0 - 8.5 g/dL 7.0  Total Bilirubin 0.0 - 1.2 mg/dL 0.7  Alkaline Phos 39 - 117 IU/L 52  AST 0 - 40 IU/L 11  ALT 0 - 32 IU/L 16   Edinburgh Score: Edinburgh Postnatal Depression Scale Screening Tool 10/11/2019  I have been able to laugh and see the funny side of things. 0  I have looked forward with enjoyment to things. 0  I have blamed myself unnecessarily when things went wrong. 0  I have been anxious or worried for no good reason. 0  I have felt scared or panicky for no good reason. 0  Things have been getting on top of me. 0  I have been so unhappy that I have had difficulty sleeping. 0  I have felt sad or miserable. 0  I have been so unhappy that I have been crying. 0  The thought of harming myself has occurred to me. 0  Edinburgh Postnatal Depression Scale Total 0    Discharge instruction: per After Visit Summary and "Baby and Me Booklet".  After visit meds:  Allergies as of 10/12/2019   No Known Allergies     Medication List    TAKE these medications   acetaminophen 325 MG tablet Commonly known as: Tylenol Take 2 tablets (650 mg total) by mouth every 6 (six) hours as needed (for pain scale < 4). What changed:   medication strength  how much to take  when to take this  reasons to take  this   Blood Pressure Kit Devi 1 Device by Does not apply route as needed.   ibuprofen 600 MG tablet Commonly known as: ADVIL Take 1 tablet (600 mg total) by mouth every 8 (eight) hours as needed for mild pain.   Prenatal Plus Iron 29-1 MG Tabs 1 tab by mouth daily       Diet: routine diet  Activity: Advance as tolerated. Pelvic rest for 6 weeks.   Outpatient follow up:4 weeks Follow up Appt: Future Appointments  Date Time Provider Department Center  11/09/2019 10:30 AM Arnold, James G, MD [facility] None   Follow up Visit:    Please schedule this patient for Postpartum visit in: 4 weeks with the following provider: Any provider Virtual For C/S patients schedule nurse incision check in weeks 2 weeks: no Low risk pregnancy complicated by: none Delivery mode:  SVD Anticipated Birth Control:  other/unsure PP Procedures needed: none  Schedule Integrated BH visit: no     Newborn Data: Live born female  Birth Weight: 3700 g   APGAR: 8, 9  Newborn Delivery   Birth date/time: 10/11/2019 13:30:00 Delivery type: Vaginal, Spontaneous      Baby Feeding: Breast Disposition:home with mother   10/12/2019 Hailey L Sparacino, DO   

## 2019-10-11 NOTE — H&P (Addendum)
OBSTETRIC ADMISSION HISTORY AND PHYSICAL  Yvette Avila is a 29 y.o. female [phone] with IUP at [redacted]w[redacted]d presenting for SOL. She reports +FMs, No LOF, no VB, no blurry vision, headaches or peripheral edema, and RUQ pain.  She plans on breast feeding. She is undecided for birth control. [facility] --->  Estimated Date of Delivery: 10/10/19  Sono:  05/20/19  _0 , CWD, normal anatomy, cephalic presentation, EFW 318g, 63.8%lie  Prenatal History/Complications: Silent alpha thalassemia carrier  Hx of molar pregnancy   Past Medical History: Past Medical History:  Diagnosis Date  . Medical history non-contributory     Past Surgical History: Past Surgical History:  Procedure Laterality Date  . DILATION AND CURETTAGE OF UTERUS      Obstetrical History: OB History    Gravida  5   Para  3   Term  3   Preterm      AB  1   Living  3     SAB  1   TAB      Ectopic      Multiple      Live Births  3           Social History Social History   Socioeconomic History  . Marital status: Single    Spouse name: Alejanro(Alex)  . Number of children: 3  . Years of education: 12 +CNA  . Highest education level: Not on file  Occupational History  . 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 Smoking status: Former Smoker    Packs/day: 0.25    Types: Cigarettes  . Smokeless tobacco: Never Used  . Tobacco comment: stressed with her father being ill and started back up.  Substance and Sexual Activity  . Alcohol use: Yes    Alcohol/week: 0.0 standard drinks    Comment: Occasional  . Drug use: No  . Sexual activity: Yes    Birth control/protection: None  Other Topics Concern  . Not on file  Social History Narrative  . Not on file   Social Determinants of Health   Financial Resource Strain:   . Difficulty of Paying Living Expenses:   Food Insecurity:   .  Worried About RCharity fundraiserin the Last Year:   . RArboriculturistin the Last Year:   Transportation Needs:   . LFilm/video editor(Medical):   .Marland KitchenLack of Transportation (Non-Medical):   Physical Activity:   . Days of Exercise per Week:   . Minutes of Exercise per Session:   Stress:   . Feeling of Stress :   Social Connections:   . Frequency of Communication with Friends and Family:   . Frequency of Social Gatherings with Friends and Family:   . Attends Religious Services:   . Active Member of Clubs or Organizations:   . Attends CArchivistMeetings:   .Marland KitchenMarital Status:     Family History: Family History  Problem Relation Age of Onset  . Hyperlipidemia Mother   . Migraines Mother   . Diabetes Father   . Hypertension Father   . Hyperlipidemia Father   . Heart disease Father   . Eczema Son   . Allergies Son   . Ulcerative colitis Son   . Allergies Maternal Aunt   . Asthma Brother     Allergies: No Known Allergies  Medications Prior to Admission  Medication Sig Dispense Refill Last Dose  . Acetaminophen (TYLENOL PO) Take by mouth.     . Blood Pressure Monitoring (BLOOD PRESSURE KIT) DEVI 1 Device by Does not apply route as needed. 1 each 0   . Prenatal Vit-Iron Carbonyl-FA (PRENATAL PLUS IRON) 29-1 MG TABS 1 tab by mouth daily 30 tablet 11      Review of Systems   All systems reviewed and negative except as stated in HPI  Blood pressure 125/70, pulse 80, temperature 97.9 F (36.6 C), temperature source Oral, resp. rate 16, last menstrual period 01/03/2019, SpO2 100 %. General appearance: alert and no distress Lungs: clear to auscultation bilaterally Heart: regular rate  Pelvic: gravid uterus Extremities: Homans sign is negative, no sign of DVT Presentation: cephalic Fetal monitoringBaseline: 140 bpm, Variability: Good {> 6 bpm), Accelerations: Reactive and Decelerations: Absent Uterine activityFrequency: Every 4-6 minutes Dilation:  5 Effacement (%): 90 Station: -2 Exam by:: A. Gagliardo, RN   Prenatal labs: ABO, Rh: O/Positive/-- (09/29 1305) Antibody: Negative (09/29 1305) Rubella: 7.30 (09/29 1305) RPR: Non Reactive (01/07 0905)  HBsAg: Negative (09/29 1305)  HIV: Non Reactive (01/07 0905)  GBS: Negative/-- (03/10 1054)  2 hr Glucola WNL Anatomy US normal  Prenatal Transfer Tool  Maternal Diabetes: No Genetic Screening: Normal Maternal Ultrasounds/Referrals: Normal Fetal Ultrasounds or other Referrals:  None Maternal Substance Abuse:  No Significant Maternal Medications:  None Significant Maternal Lab Results: Group B Strep negative  No results found for this or any previous visit (from the past 24 hour(s)).  Patient Active Problem List   Diagnosis Date Noted  . Alpha thalassemia silent carrier 04/19/2019  . Supervision of other normal pregnancy, antepartum 04/06/2019  . H/O molar pregnancy, antepartum 04/06/2019    Assessment/Plan:  Yvette Avila is a 28 y.o. G5P3013 at [redacted]w[redacted]d here for SOL.   #Labor: Admit to L&D. Expectant management.  #Pain: Fentanyl prn, epidural per request  #FWB:  Cat 1  #ID:  None #MOF: breast  #MOC: undecided  #Circ:  NA, girl  Yvette Brimage, DO  10/11/2019, 12:41 PM  I saw and evaluated the patient. I agree with the findings and the plan of care as documented in the resident's note. EFW 3800g, proven to 4200g. Expectant management. Does not desire epidural. Will plan to AROM per patient request. Anticipate SVD.   Chelsea Fair, MD OB Family Medicine Fellow, Faculty Practice [facility], [facility]   

## 2019-10-11 NOTE — MAU Note (Signed)
Pt states she started having contractions around 0830 that were 20 minutes apart but since 0900 they have been every 5 minutes apart and stronger. Denies LOF. Some blood mixed with mucus this morning. Reports good fetal movement, denies complications with pregnancy.

## 2019-10-11 NOTE — Lactation Note (Signed)
This note was copied from a baby's chart. Lactation Consultation Note  Patient Name: Yvette Avila [id number] Date: 10/11/2019 Reason for consult: Initial assessment;Term  [name] is 4 hours old, of a P3 mother with breastfeeding experience. Mother is eager to breastfeed and baby has been to breast 3 times since birth. Mother declined assistance at this time since baby is asleep but agrees to call for help PRN. Reviewed breastfeeding basics, signs of hunger and fullness as well as benefits of STS. Mother verbalizes understanding. Reviewed Lactation services brochure with mother and encouraged to contact [facility] for support and questions.   Interventions Interventions: Breast feeding basics reviewed;Skin to skin   Consult Status Consult Status: Follow-up Date: 10/12/19    Arpi Diebold A Higuera Ancidey 10/11/2019, 6:18 PM

## 2019-10-12 ENCOUNTER — Encounter: Payer: No Typology Code available for payment source | Admitting: Obstetrics & Gynecology

## 2019-10-12 LAB — RPR: RPR Ser Ql: NONREACTIVE

## 2019-10-12 MED ORDER — ACETAMINOPHEN 325 MG PO TABS: 650.0000 mg | ORAL_TABLET | Freq: Four times a day (QID) | ORAL | 0 refills | Status: AC | PRN

## 2019-10-12 MED ORDER — IBUPROFEN 600 MG PO TABS: 600.0000 mg | ORAL_TABLET | Freq: Three times a day (TID) | ORAL | 0 refills | Status: DC | PRN

## 2019-10-12 NOTE — Progress Notes (Addendum)
POSTPARTUM PROGRESS NOTE PPD: 1 Vaginal Delivery  Subjective: Yvette Avila is a 29 y.o. [id number] s/p VD at [redacted]w[redacted]d, admitted for SOL. She reports she doing well. No acute events overnight. She denies any problems with ambulating, voiding or po intake. Denies nausea or vomiting. Pain is well controlled, states she has cramping at her vaginal laceration site. Pt compares amount of vaginal bleeding to her regular menstrual cycle.  Objective: Blood pressure 107/66, pulse 70, temperature 98.6 F (37 C), temperature source Oral, resp. rate 18, height 5\' 3"  (1.6 m), weight 89.8 kg, last menstrual period 01/03/2019, SpO2 100 %, unknown if currently breastfeeding.  Physical Exam:  General: alert, cooperative and no distress Chest: no respiratory distress Abdomen: soft, non-tender  Uterine Fundus: firm, appropriately tender Extremities: No calf swelling or tenderness  No edema  Recent Labs    10/11/19 1251  HGB 11.9*  HCT 38.0   Vitals:   10/12/19 0028 10/12/19 0412  BP: 108/63 107/66  Pulse: (!) 57 70  Resp: 18 18  Temp: 98.5 F (36.9 C) 98.6 F (37 C)  SpO2:     Assessment/Plan: Yvette Avila is a 29 y.o. 26 s/p VD at [redacted]w[redacted]d for SOL.  Routine Postpartum Care: Doing well, pain well-controlled.  -- Continue routine care, lactation support  -- Contraception: undecided -- Feeding: Breast feeding  Dispo: Plan for discharge today (10/12/2019).  12/12/2019, PA-S 10/12/2019, 7:30 AM  GME ATTESTATION:  I saw and evaluated the patient. I agree with the findings and the plan of care as documented in the student's note- see DC summary for details  12/12/2019, DO OB Fellow, Faculty Practice [facility], [facility] 10/12/2019 9:48 AM

## 2019-10-15 ENCOUNTER — Other Ambulatory Visit (HOSPITAL_COMMUNITY): Payer: No Typology Code available for payment source

## 2019-10-17 ENCOUNTER — Inpatient Hospital Stay (HOSPITAL_COMMUNITY)
Admission: AD | Admit: 2019-10-17 | Payer: No Typology Code available for payment source | Source: Home / Self Care | Admitting: Obstetrics & Gynecology

## 2019-10-17 ENCOUNTER — Inpatient Hospital Stay (HOSPITAL_COMMUNITY): Payer: No Typology Code available for payment source

## 2019-11-09 ENCOUNTER — Telehealth: Payer: Self-pay | Admitting: Obstetrics & Gynecology

## 2019-11-10 ENCOUNTER — Telehealth: Payer: Self-pay | Admitting: Obstetrics & Gynecology

## 2021-04-20 ENCOUNTER — Telehealth: Payer: Self-pay | Admitting: Internal Medicine

## 2022-01-28 ENCOUNTER — Other Ambulatory Visit (HOSPITAL_COMMUNITY)
Admission: RE | Admit: 2022-01-28 | Discharge: 2022-01-28 | Disposition: A | Discharge status: Home / Self Care | Payer: Self-pay | Source: Ambulatory Visit | Attending: Advanced Practice Midwife | Admitting: Advanced Practice Midwife

## 2022-01-28 ENCOUNTER — Ambulatory Visit (INDEPENDENT_AMBULATORY_CARE_PROVIDER_SITE_OTHER): Payer: Self-pay | Admitting: Advanced Practice Midwife

## 2022-01-28 ENCOUNTER — Encounter: Payer: Self-pay | Admitting: Advanced Practice Midwife

## 2022-01-28 VITALS — BP 116/66 | HR 76 | Wt 185.2 lb

## 2022-01-28 DIAGNOSIS — Z3480 Encounter for supervision of other normal pregnancy, unspecified trimester: Secondary | ICD-10-CM | POA: Insufficient documentation

## 2022-01-28 DIAGNOSIS — O99011 Anemia complicating pregnancy, first trimester: Secondary | ICD-10-CM

## 2022-01-28 DIAGNOSIS — Z3481 Encounter for supervision of other normal pregnancy, first trimester: Secondary | ICD-10-CM

## 2022-01-28 DIAGNOSIS — Z3A12 12 weeks gestation of pregnancy: Secondary | ICD-10-CM

## 2022-01-28 NOTE — Progress Notes (Signed)
Subjective:   Yvette Avila is a 31 y.o. [id number] at 31w3dby LMP being seen today for her first obstetrical visit.  Her obstetrical history is significant for  NSVD x 4 and molar pregnancy  and has H/O molar pregnancy, antepartum; Alpha thalassemia silent carrier; and Supervision of other normal pregnancy, antepartum on their problem list.. Patient does intend to breast feed. Pregnancy history fully reviewed.  Patient reports no complaints.  HISTORY: OB History  Gravida Para Term Preterm AB Living  _0 0 1 4  SAB IAB Ectopic Multiple Live Births  1 0 0 0 4    # Outcome Date GA Lbr Len/2nd Weight Sex Delivery Anes PTL Lv  6 Current           5 Term 10/11/19 451w1d4:58 / 00:02 8 lb 2.5 oz (3.7 kg) F Vag-Spont Local  LIV     Name: ALFARO-RUIZ,GIRL Monteen     Apgar1: 8  Apgar5: 9  4 Term 10/12/12 3929w2d:19 / 00:10 9 lb 4.7 oz (4.215 kg) M Vag-Spont Other  LIV     Name: ALFARO-RUIZ,BOY Agatha     Apgar1: 9  Apgar5: 9  3 Term 2009 40w8w0dlb 12 oz (3.515 kg)  Vag-Spont   LIV  2 SAB 2008             Birth Comments: molar  1 Term 2007 40w025w0db (3.175 kg) M Vag-Spont   LIV   Past Medical History:  Diagnosis Date   Medical history non-contributory    Past Surgical History:  Procedure Laterality Date   DILATION AND CURETTAGE OF UTERUS     Family History  Problem Relation Age of Onset   Hyperlipidemia Mother    Migraines Mother    Diabetes Father    Hypertension Father    Hyperlipidemia Father    Heart disease Father    Eczema Son    Allergies Son    Ulcerative colitis Son    Allergies Maternal Aunt    Asthma Brother    Social History   Tobacco Use   Smoking status: Former    Packs/day: 0.25    Types: Cigarettes   Smokeless tobacco: Never   Tobacco comments:    stressed with her father being ill and started back up.  Vaping Use   Vaping Use: Never used  Substance Use Topics   Alcohol use: Yes    Alcohol/week: 0.0 standard drinks of  alcohol    Comment: Occasional   Drug use: No   No Known Allergies Current Outpatient Medications on File Prior to Visit  Medication Sig Dispense Refill   acetaminophen (TYLENOL) 325 MG tablet Take 2 tablets (650 mg total) by mouth every 6 (six) hours as needed (for pain scale < 4). 30 tablet 0   Prenatal MV & Min w/FA-DHA (PRENATAL GUMMIES) 0.18-25 MG CHEW Chew by mouth.     Blood Pressure Monitoring (BLOOD PRESSURE KIT) DEVI 1 Device by Does not apply route as needed. (Patient not taking: Reported on 01/28/2022) 1 each 0   ibuprofen (ADVIL) 600 MG tablet Take 1 tablet (600 mg total) by mouth every 8 (eight) hours as needed for mild pain. (Patient not taking: Reported on 01/28/2022) 30 tablet 0   Prenatal Vit-Iron Carbonyl-FA (PRENATAL PLUS IRON) 29-1 MG TABS 1 tab by mouth daily (Patient not taking: Reported on 01/28/2022) 30 tablet 11   No current facility-administered medications on file prior to visit.  Indications for ASA therapy (per uptodate) One of the following: Previous pregnancy with preeclampsia, especially early onset and with an adverse outcome No Multifetal gestation No Chronic hypertension No Type 1 or 2 diabetes mellitus No Chronic kidney disease No Autoimmune disease (antiphospholipid syndrome, systemic lupus erythematosus) No   Two or more of the following: Nulliparity No Obesity (body mass index >30 kg/m2) Yes Family history of preeclampsia in mother or sister No Age ?35 years No Sociodemographic characteristics (African American race, low socioeconomic level) No Personal risk factors (eg, previous pregnancy with low birth weight or small for gestational age infant, previous adverse pregnancy outcome [eg, stillbirth], interval >10 years between pregnancies) No   Indications for early 1 hour GTT (per uptodate)  BMI >25 (>23 in Asian women) AND one of the following  Gestational diabetes mellitus in a previous pregnancy No Glycated hemoglobin ?5.7 percent (39  mmol/mol), impaired glucose tolerance, or impaired fasting glucose on previous testing No First-degree relative with diabetes No High-risk race/ethnicity (eg, African American, Latino, Native American, [place] American, Pacific Islander) Yes History of cardiovascular disease No Hypertension or on therapy for hypertension No High-density lipoprotein cholesterol level <35 mg/dL (0.90 mmol/L) and/or a triglyceride level >250 mg/dL (2.82 mmol/L) No Polycystic ovary syndrome No Physical inactivity No Other clinical condition associated with insulin resistance (eg, severe obesity, acanthosis nigricans) No Previous birth of an infant weighing ?4000 g No Previous stillbirth of unknown cause No Exam   Vitals:   01/28/22 1421  BP: 116/66  Pulse: 76  Weight: 185 lb 3.2 oz (84 kg)   Fetal Heart Rate (bpm): 160  Uterus:     Pelvic Exam: Perineum: no hemorrhoids, normal perineum   Vulva: normal external genitalia, no lesions   Vagina:  normal mucosa, normal discharge   Cervix: no lesions and normal, pap smear done.    Adnexa: normal adnexa and no mass, fullness, tenderness   Bony Pelvis: average  System: General: well-developed, well-nourished female in no acute distress   Breast:  normal appearance, no masses or tenderness   Skin: normal coloration and turgor, no rashes   Neurologic: oriented, normal, negative, normal mood   Extremities: normal strength, tone, and muscle mass, ROM of all joints is normal   HEENT PERRLA, extraocular movement intact and sclera clear, anicteric   Mouth/Teeth mucous membranes moist, pharynx normal without lesions and dental hygiene good   Neck supple and no masses   Cardiovascular: regular rate and rhythm   Respiratory:  no respiratory distress, normal breath sounds   Abdomen: soft, non-tender; bowel sounds normal; no masses,  no organomegaly     Assessment:   Pregnancy: [id number] Patient Active Problem List   Diagnosis Date Noted   Supervision of other  normal pregnancy, antepartum 01/28/2022   Alpha thalassemia silent carrier 04/19/2019   H/O molar pregnancy, antepartum 04/06/2019     Plan:  1. Supervision of other normal pregnancy, antepartum --Anticipatory guidance about next visits/weeks of pregnancy given.  --Pt had carrier screening with previous pregnancy, silent carrier for alpha thalassemia.     - CBC/D/Plt+RPR+Rh+ABO+RubIgG... - Hepatitis C Antibody - Culture, OB Urine - Pap--refer to [facility]CP - Panorama Prenatal Test Full Panel - Cervicovaginal ancillary only( [place]) - Hemoglobin A1c  2. [redacted] weeks gestation of pregnancy      Initial labs drawn. Continue prenatal vitamins. Discussed and offered genetic screening options, including Quad screen/AFP, NIPS testing, and option to decline testing. Benefits/risks/alternatives reviewed. Pt aware that anatomy US is form of genetic screening with  lower accuracy in detecting trisomies than blood work.  Pt chooses genetic screening today. NIPS: ordered. Ultrasound discussed; fetal anatomic survey: ordered. Problem list reviewed and updated. The nature of [place] with multiple MDs and other Advanced Practice Providers was explained to patient; also emphasized that residents, students are part of our team. Routine obstetric precautions reviewed. Return in about 4 weeks (around 02/25/2022) for Any provider.   Fatima Blank, CNM 01/28/22 5:52 PM

## 2022-01-28 NOTE — Progress Notes (Signed)
NOB in office, [facility] on 12/31/21

## 2022-01-29 LAB — CBC/D/PLT+RPR+RH+ABO+RUBIGG...
Antibody Screen: NEGATIVE
Basophils Absolute: 0 10*3/uL (ref 0.0–0.2)
Basos: 0 %
EOS (ABSOLUTE): 0.2 10*3/uL (ref 0.0–0.4)
Eos: 3 %
HCV Ab: NONREACTIVE
HIV Screen 4th Generation wRfx: NONREACTIVE
Hematocrit: 32.9 % — ABNORMAL LOW (ref 34.0–46.6)
Hemoglobin: 10.6 g/dL — ABNORMAL LOW (ref 11.1–15.9)
Hepatitis B Surface Ag: NEGATIVE
Immature Grans (Abs): 0 10*3/uL (ref 0.0–0.1)
Immature Granulocytes: 0 %
Lymphocytes Absolute: 2.3 10*3/uL (ref 0.7–3.1)
Lymphs: 33 %
MCH: 26.1 pg — ABNORMAL LOW (ref 26.6–33.0)
MCHC: 32.2 g/dL (ref 31.5–35.7)
MCV: 81 fL (ref 79–97)
Monocytes Absolute: 0.5 10*3/uL (ref 0.1–0.9)
Monocytes: 7 %
Neutrophils Absolute: 4 10*3/uL (ref 1.4–7.0)
Neutrophils: 57 %
Platelets: 323 10*3/uL (ref 150–450)
RBC: 4.06 x10E6/uL (ref 3.77–5.28)
RDW: 13.2 % (ref 11.7–15.4)
RPR Ser Ql: NONREACTIVE
Rh Factor: POSITIVE
Rubella Antibodies, IGG: 10.4 index (ref >0.99)
WBC: 7.1 10*3/uL (ref 3.4–10.8)

## 2022-01-29 LAB — HEPATITIS C ANTIBODY: Hep C Virus Ab: NONREACTIVE

## 2022-01-29 LAB — CERVICOVAGINAL ANCILLARY ONLY
Bacterial Vaginitis (gardnerella): NEGATIVE
Candida Glabrata: NEGATIVE
Candida Vaginitis: NEGATIVE
Chlamydia: NEGATIVE
Comment: NEGATIVE
Comment: NEGATIVE
Comment: NEGATIVE
Comment: NEGATIVE
Comment: NEGATIVE
Comment: NORMAL
Neisseria Gonorrhea: NEGATIVE
Trichomonas: NEGATIVE

## 2022-01-29 LAB — HEMOGLOBIN A1C
Est. average glucose Bld gHb Est-mCnc: 111 mg/dL
Hgb A1c MFr Bld: 5.5 % (ref 4.8–5.6)

## 2022-01-29 LAB — HCV INTERPRETATION

## 2022-01-31 LAB — URINE CULTURE, OB REFLEX: Organism ID, Bacteria: NO GROWTH

## 2022-01-31 LAB — CULTURE, OB URINE

## 2022-02-01 DIAGNOSIS — O99011 Anemia complicating pregnancy, first trimester: Secondary | ICD-10-CM | POA: Insufficient documentation

## 2022-02-01 LAB — CYTOLOGY - PAP
Adequacy: ABSENT
Comment: NEGATIVE
Diagnosis: NEGATIVE
High risk HPV: NEGATIVE

## 2022-02-01 MED ORDER — FERROUS SULFATE 325 (65 FE) MG PO TABS: 325.0000 mg | ORAL_TABLET | ORAL | 2 refills | Status: AC

## 2022-02-01 NOTE — Addendum Note (Signed)
Addended by: Sharen Counter A on: 02/01/2022 07:53 PM   Modules accepted: Orders

## 2022-02-04 ENCOUNTER — Encounter: Payer: Self-pay | Admitting: Advanced Practice Midwife

## 2022-02-27 ENCOUNTER — Ambulatory Visit (INDEPENDENT_AMBULATORY_CARE_PROVIDER_SITE_OTHER): Payer: Self-pay | Admitting: Family Medicine

## 2022-02-27 VITALS — BP 100/59 | HR 100 | Wt 188.0 lb

## 2022-02-27 DIAGNOSIS — O99012 Anemia complicating pregnancy, second trimester: Secondary | ICD-10-CM

## 2022-02-27 DIAGNOSIS — O99011 Anemia complicating pregnancy, first trimester: Secondary | ICD-10-CM

## 2022-02-27 DIAGNOSIS — Z3480 Encounter for supervision of other normal pregnancy, unspecified trimester: Secondary | ICD-10-CM

## 2022-02-27 DIAGNOSIS — Z3A16 16 weeks gestation of pregnancy: Secondary | ICD-10-CM

## 2022-02-27 DIAGNOSIS — Z3482 Encounter for supervision of other normal pregnancy, second trimester: Secondary | ICD-10-CM

## 2022-02-27 NOTE — Addendum Note (Signed)
Addended by: Alfredia Ferguson on: 02/27/2022 09:19 AM   Modules accepted: Orders

## 2022-02-27 NOTE — Progress Notes (Addendum)
   PRENATAL VISIT NOTE  Subjective:  Yvette Avila is a 31 y.o. O1B5102 at [redacted]w[redacted]d being seen today for ongoing prenatal care.  She is currently monitored for the following issues for this low-risk pregnancy and has H/O molar pregnancy, antepartum; Alpha thalassemia silent carrier; Supervision of other normal pregnancy, antepartum; and Anemia affecting pregnancy in first trimester on their problem list.  Patient reports no complaints.  Contractions: Not present. Vag. Bleeding: None.   . Denies leaking of fluid.   The following portions of the patient's history were reviewed and updated as appropriate: allergies, current medications, past family history, past medical history, past social history, past surgical history and problem list.   Objective:   Vitals:   02/27/22 0833  BP: (!) 100/59  Pulse: 100  Weight: 188 lb (85.3 kg)    Fetal Status: Fetal Heart Rate (bpm): 153 Fundal Height: 15 cm       General:  Alert, oriented and cooperative. Patient is in no acute distress.  Skin: Skin is warm and dry. No rash noted.   Cardiovascular: Normal heart rate noted  Respiratory: Normal respiratory effort, no problems with respiration noted  Abdomen: Soft, gravid, appropriate for gestational age.  Pain/Pressure: Absent     Pelvic: Cervical exam deferred        Extremities: Normal range of motion.  Edema: None  Mental Status: Normal mood and affect. Normal behavior. Normal judgment and thought content.   Assessment and Plan:  Pregnancy: [id number] at [redacted]w[redacted]d 1. Supervision of other normal pregnancy, antepartum [redacted] weeks gestation of pregnancy Doing well overall. Reviewed initial labs. Anemia noted as below. No other concerns.  Will get AFP today and order anatomy scan. - AFP, Serum, Open Spina Bifida - US OB Detail + 14 Wk; Future  2. Anemia affecting pregnancy in first trimester Hgb 10. On initial labs. Has started iron PO. Feels fatigue is better since then - continue PO iron. -  recheck labs in 3rd trimester  Preterm labor symptoms and general obstetric precautions including but not limited to vaginal bleeding, contractions, leaking of fluid and fetal movement were reviewed in detail with the patient. Please refer to After Visit Summary for other counseling recommendations.   Return in 4 weeks (on 03/27/2022) for lob, routine OB visit.  Future Appointments  Date Time Provider Department Center  03/27/2022  8:55 AM Warden Fillers, MD [facility] None    Sheppard Evens MD MPH OB Fellow, Faculty Practice

## 2022-03-01 LAB — AFP, SERUM, OPEN SPINA BIFIDA
AFP MoM: 0.83
AFP Value: 26.5 ng/mL
Gest. Age on Collection Date: 16.5 weeks
Maternal Age At EDD: 33.6 yr
OSBR Risk 1 IN: 10000
Test Results:: NEGATIVE
Weight: 188 [lb_av]

## 2022-03-20 ENCOUNTER — Encounter: Payer: Self-pay | Admitting: *Deleted

## 2022-03-20 ENCOUNTER — Other Ambulatory Visit: Payer: Self-pay | Admitting: *Deleted

## 2022-03-20 ENCOUNTER — Ambulatory Visit: Payer: Self-pay | Admitting: *Deleted

## 2022-03-20 ENCOUNTER — Ambulatory Visit: Payer: Self-pay | Attending: Family Medicine

## 2022-03-20 ENCOUNTER — Ambulatory Visit: Payer: Self-pay

## 2022-03-20 VITALS — BP 118/56 | HR 83

## 2022-03-20 DIAGNOSIS — Z3A19 19 weeks gestation of pregnancy: Secondary | ICD-10-CM | POA: Insufficient documentation

## 2022-03-20 DIAGNOSIS — Z3689 Encounter for other specified antenatal screening: Secondary | ICD-10-CM | POA: Insufficient documentation

## 2022-03-20 DIAGNOSIS — Z363 Encounter for antenatal screening for malformations: Secondary | ICD-10-CM | POA: Insufficient documentation

## 2022-03-20 DIAGNOSIS — O09A0 Supervision of pregnancy with history of molar pregnancy, unspecified trimester: Secondary | ICD-10-CM

## 2022-03-20 DIAGNOSIS — Z3A16 16 weeks gestation of pregnancy: Secondary | ICD-10-CM | POA: Insufficient documentation

## 2022-03-20 DIAGNOSIS — Z3480 Encounter for supervision of other normal pregnancy, unspecified trimester: Secondary | ICD-10-CM | POA: Insufficient documentation

## 2022-03-20 DIAGNOSIS — O99212 Obesity complicating pregnancy, second trimester: Secondary | ICD-10-CM | POA: Insufficient documentation

## 2022-03-20 DIAGNOSIS — D649 Anemia, unspecified: Secondary | ICD-10-CM | POA: Insufficient documentation

## 2022-03-20 DIAGNOSIS — Z6830 Body mass index (BMI) 30.0-30.9, adult: Secondary | ICD-10-CM

## 2022-03-20 DIAGNOSIS — D563 Thalassemia minor: Secondary | ICD-10-CM

## 2022-03-20 DIAGNOSIS — O09292 Supervision of pregnancy with other poor reproductive or obstetric history, second trimester: Secondary | ICD-10-CM | POA: Insufficient documentation

## 2022-03-27 ENCOUNTER — Ambulatory Visit (INDEPENDENT_AMBULATORY_CARE_PROVIDER_SITE_OTHER): Payer: Self-pay | Admitting: Obstetrics and Gynecology

## 2022-03-27 ENCOUNTER — Encounter: Payer: Self-pay | Admitting: Obstetrics and Gynecology

## 2022-03-27 VITALS — BP 118/72 | HR 78 | Wt 194.7 lb

## 2022-03-27 DIAGNOSIS — D563 Thalassemia minor: Secondary | ICD-10-CM

## 2022-03-27 DIAGNOSIS — Z3482 Encounter for supervision of other normal pregnancy, second trimester: Secondary | ICD-10-CM

## 2022-03-27 DIAGNOSIS — Z3480 Encounter for supervision of other normal pregnancy, unspecified trimester: Secondary | ICD-10-CM

## 2022-03-27 DIAGNOSIS — Z789 Other specified health status: Secondary | ICD-10-CM | POA: Insufficient documentation

## 2022-03-27 DIAGNOSIS — Z3A20 20 weeks gestation of pregnancy: Secondary | ICD-10-CM

## 2022-03-27 NOTE — Progress Notes (Signed)
Pt presents for ROB visit. Pt states her right arm is numb when she wakes up. No other concerns at this time.

## 2022-03-27 NOTE — Progress Notes (Signed)
   PRENATAL VISIT NOTE  Subjective:  Yvette Avila is a 31 y.o. S2G3151 at [redacted]w[redacted]d being seen today for ongoing prenatal care.  She is currently monitored for the following issues for this low-risk pregnancy and has H/O molar pregnancy, antepartum; Alpha thalassemia silent carrier; Supervision of other normal pregnancy, antepartum; Anemia affecting pregnancy in first trimester; and Language barrier on their problem list.  Patient doing well with no acute concerns today. She reports no complaints.  Contractions: Not present. Vag. Bleeding: None.  Movement: Present. Denies leaking of fluid.   The following portions of the patient's history were reviewed and updated as appropriate: allergies, current medications, past family history, past medical history, past social history, past surgical history and problem list. Problem list updated.  Objective:   Vitals:   03/27/22 0904  BP: 118/72  Pulse: 78  Weight: 194 lb 11.2 oz (88.3 kg)  Positive phalen's test in right hand c/w carpal tunne  Fetal Status: Fetal Heart Rate (bpm): 153   Movement: Present     General:  Alert, oriented and cooperative. Patient is in no acute distress.  Skin: Skin is warm and dry. No rash noted.   Cardiovascular: Normal heart rate noted  Respiratory: Normal respiratory effort, no problems with respiration noted  Abdomen: Soft, gravid, appropriate for gestational age.  Pain/Pressure: Absent     Pelvic: Cervical exam deferred        Extremities: Normal range of motion.  Edema: None  Mental Status:  Normal mood and affect. Normal behavior. Normal judgment and thought content.   Assessment and Plan:  Pregnancy: V6H6073 at [redacted]w[redacted]d  1. [redacted] weeks gestation of pregnancy   2. Alpha thalassemia silent carrier   3. Supervision of other normal pregnancy, antepartum Continue routine prenatal care Mild carpal tunnel in right wrsit  Pt advised to purchase OTC brace  4. Language barrier Pt speaks perfect  English  Preterm labor symptoms and general obstetric precautions including but not limited to vaginal bleeding, contractions, leaking of fluid and fetal movement were reviewed in detail with the patient.  Please refer to After Visit Summary for other counseling recommendations.   Return in about 4 weeks (around 04/24/2022) for ROB, virtual.   Lynnda Shields, MD Faculty Attending Center for [facility]

## 2022-04-03 LAB — PANORAMA PRENATAL TEST FULL PANEL:PANORAMA TEST PLUS 5 ADDITIONAL MICRODELETIONS: FETAL FRACTION: 8.1

## 2022-04-24 ENCOUNTER — Other Ambulatory Visit: Payer: Self-pay | Admitting: *Deleted

## 2022-04-24 ENCOUNTER — Other Ambulatory Visit: Payer: Self-pay

## 2022-04-24 ENCOUNTER — Telehealth (INDEPENDENT_AMBULATORY_CARE_PROVIDER_SITE_OTHER): Payer: Self-pay | Admitting: Certified Nurse Midwife

## 2022-04-24 ENCOUNTER — Ambulatory Visit: Payer: Self-pay | Admitting: *Deleted

## 2022-04-24 ENCOUNTER — Ambulatory Visit: Payer: Self-pay | Attending: Obstetrics

## 2022-04-24 VITALS — BP 127/58 | HR 72

## 2022-04-24 DIAGNOSIS — Z363 Encounter for antenatal screening for malformations: Secondary | ICD-10-CM

## 2022-04-24 DIAGNOSIS — O09A2 Supervision of pregnancy with history of molar pregnancy, second trimester: Secondary | ICD-10-CM

## 2022-04-24 DIAGNOSIS — Z362 Encounter for other antenatal screening follow-up: Secondary | ICD-10-CM | POA: Insufficient documentation

## 2022-04-24 DIAGNOSIS — Z3A24 24 weeks gestation of pregnancy: Secondary | ICD-10-CM

## 2022-04-24 DIAGNOSIS — Z6830 Body mass index (BMI) 30.0-30.9, adult: Secondary | ICD-10-CM | POA: Insufficient documentation

## 2022-04-24 DIAGNOSIS — O99212 Obesity complicating pregnancy, second trimester: Secondary | ICD-10-CM

## 2022-04-24 DIAGNOSIS — Z789 Other specified health status: Secondary | ICD-10-CM

## 2022-04-24 DIAGNOSIS — Z148 Genetic carrier of other disease: Secondary | ICD-10-CM

## 2022-04-24 DIAGNOSIS — O99112 Other diseases of the blood and blood-forming organs and certain disorders involving the immune mechanism complicating pregnancy, second trimester: Secondary | ICD-10-CM

## 2022-04-24 DIAGNOSIS — R638 Other symptoms and signs concerning food and fluid intake: Secondary | ICD-10-CM

## 2022-04-24 DIAGNOSIS — D563 Thalassemia minor: Secondary | ICD-10-CM

## 2022-04-24 DIAGNOSIS — O09A0 Supervision of pregnancy with history of molar pregnancy, unspecified trimester: Secondary | ICD-10-CM | POA: Insufficient documentation

## 2022-04-24 DIAGNOSIS — O321XX0 Maternal care for breech presentation, not applicable or unspecified: Secondary | ICD-10-CM

## 2022-04-24 DIAGNOSIS — E669 Obesity, unspecified: Secondary | ICD-10-CM

## 2022-04-24 DIAGNOSIS — Z3480 Encounter for supervision of other normal pregnancy, unspecified trimester: Secondary | ICD-10-CM

## 2022-04-24 NOTE — Progress Notes (Signed)
Patient presents for virtual ROB. Patient identified with two patient identifiers. Had follow up u/s this morning. States everything went well. Patient has no concerns today.

## 2022-04-24 NOTE — Progress Notes (Signed)
I connected with Yvette Avila 04/24/22 at  9:35 AM EDT by: MyChart video and verified that I am speaking with the correct person using two identifiers.  Patient is located at Home and provider is located at [place].     I discussed the limitations, risks, security and privacy concerns of performing an evaluation and management service by MyChart video and the availability of in person appointments. I also discussed with the patient that there may be a patient responsible charge related to this service. By engaging in this virtual visit, you consent to the provision of healthcare.  Additionally, you authorize for your insurance to be billed for the services provided during this visit.  The patient expressed understanding and agreed to proceed.  The following staff members participated in the virtual visit:  Seline Enzor Isaias Sakai) Rollene Rotunda, MSN, CNM      PRENATAL VISIT NOTE  Subjective:  Yvette Avila is a 31 y.o. [id number] at [redacted]w[redacted]d  for virtual visit for ongoing prenatal care.  She is currently monitored for the following issues for this low-risk pregnancy and has H/O molar pregnancy, antepartum; Alpha thalassemia silent carrier; Supervision of other normal pregnancy, antepartum; and Anemia affecting pregnancy in first trimester on their problem list.  Patient reports no complaints.  Contractions: Not present. Vag. Bleeding: None.  Movement: Present. Denies leaking of fluid.   The following portions of the patient's history were reviewed and updated as appropriate: allergies, current medications, past family history, past medical history, past social history, past surgical history and problem list.   Objective:  There were no vitals filed for this visit. Self-Obtained  Fetal Status:     Movement: Present     Assessment and Plan:  Pregnancy: L2G4010 at [redacted]w[redacted]d 1. Supervision of other normal pregnancy, antepartum - Patient feeling frequent and vigorous fetal movement   2. [redacted] weeks  gestation of pregnancy - GTT at next visit.  - Anticipatory guidance provided on duration of visit and intake restrictions  the night before the appointment.  - Patient verbalized understanding.   3. Language barrier - Patient spoke english during encounter today. Spanish is patient first language.   4. Breech presentation, single or unspecified fetus - Patient voiced concerns for multiple [place] and breech presentation.  - Per MFM follow up growth scans recommended due to maternal increase in weight gain. Reviewed findings with patient and the importance of following fetal growth with patient.  - Reassured patient that 24 weeks is still early pregnant and that the fetus still has time to turn vertex. Patient endorses feeling baby from side to side at this time.  - Education provided on spinning babies and the option of a ECV if clinically indicated and patient desires.    Preterm labor symptoms and general obstetric precautions including but not limited to vaginal bleeding, contractions, leaking of fluid and fetal movement were reviewed in detail with the patient.  Return in about 4 weeks (around 05/22/2022) for LOB w GTT.  Future Appointments  Date Time Provider [place]  06/19/2022  9:15 AM [facility] NURSE [facility]  06/19/2022  9:30 AM [facility] US2 [facility] [place]     Time spent on virtual visit: 20 minutes  Deniece Rankin Isaias Sakai) Rollene Rotunda, MSN, [place] for [place]  04/24/22 12:53 PM

## 2022-05-29 ENCOUNTER — Encounter: Payer: Self-pay | Admitting: Obstetrics

## 2022-05-29 ENCOUNTER — Ambulatory Visit (INDEPENDENT_AMBULATORY_CARE_PROVIDER_SITE_OTHER): Payer: Self-pay | Admitting: Obstetrics

## 2022-05-29 ENCOUNTER — Other Ambulatory Visit: Payer: Self-pay

## 2022-05-29 VITALS — BP 117/72 | HR 106 | Wt 208.4 lb

## 2022-05-29 DIAGNOSIS — Z3480 Encounter for supervision of other normal pregnancy, unspecified trimester: Secondary | ICD-10-CM

## 2022-05-29 DIAGNOSIS — Z3A29 29 weeks gestation of pregnancy: Secondary | ICD-10-CM

## 2022-05-29 DIAGNOSIS — Z3483 Encounter for supervision of other normal pregnancy, third trimester: Secondary | ICD-10-CM

## 2022-05-29 NOTE — Addendum Note (Signed)
Addended by: Jearld Adjutant on: 05/29/2022 09:53 AM   Modules accepted: Orders

## 2022-05-29 NOTE — Progress Notes (Signed)
Subjective:  Yvette Avila is a 31 y.o. [id number] at [redacted]w[redacted]d being seen today for ongoing prenatal care.  She is currently monitored for the following issues for this low-risk pregnancy and has H/O molar pregnancy, antepartum; Alpha thalassemia silent carrier; Supervision of other normal pregnancy, antepartum; and Anemia affecting pregnancy in first trimester on their problem list.  Patient reports no complaints.  Contractions: Not present. Vag. Bleeding: None.  Movement: Present. Denies leaking of fluid.   The following portions of the patient's history were reviewed and updated as appropriate: allergies, current medications, past family history, past medical history, past social history, past surgical history and problem list. Problem list updated.  Objective:   Vitals:   05/29/22 0822  BP: 117/72  Pulse: (!) 106  Weight: 208 lb 6.4 oz (94.5 kg)    Fetal Status: Fetal Heart Rate (bpm): 146   Movement: Present     General:  Alert, oriented and cooperative. Patient is in no acute distress.  Skin: Skin is warm and dry. No rash noted.   Cardiovascular: Normal heart rate noted  Respiratory: Normal respiratory effort, no problems with respiration noted  Abdomen: Soft, gravid, appropriate for gestational age. Pain/Pressure: Absent     Pelvic:  Cervical exam deferred        Extremities: Normal range of motion.  Edema: None  Mental Status: Normal mood and affect. Normal behavior. Normal judgment and thought content.   Urinalysis:      Assessment and Plan:  Pregnancy: H8I5027 at [redacted]w[redacted]d  1. Supervision of other normal pregnancy, antepartum   Preterm labor symptoms and general obstetric precautions including but not limited to vaginal bleeding, contractions, leaking of fluid and fetal movement were reviewed in detail with the patient. Please refer to After Visit Summary for other counseling recommendations.   Return in about 2 weeks (around 06/12/2022) for ROB.   Brock Bad,  MD 05/29/22

## 2022-05-29 NOTE — Progress Notes (Signed)
Pt presents for ROB visit. TDap and Flu vaccines deferred until next visit. No concerns at this time.

## 2022-05-30 ENCOUNTER — Other Ambulatory Visit: Payer: Self-pay | Admitting: Obstetrics

## 2022-05-30 DIAGNOSIS — O24410 Gestational diabetes mellitus in pregnancy, diet controlled: Secondary | ICD-10-CM

## 2022-05-30 LAB — HIV ANTIBODY (ROUTINE TESTING W REFLEX): HIV Screen 4th Generation wRfx: NONREACTIVE

## 2022-05-30 LAB — RPR: RPR Ser Ql: NONREACTIVE

## 2022-05-30 LAB — GLUCOSE TOLERANCE, 2 HOURS W/ 1HR
Glucose, 1 hour: 190 mg/dL — ABNORMAL HIGH (ref 70–179)
Glucose, 2 hour: 127 mg/dL (ref 70–152)
Glucose, Fasting: 106 mg/dL — ABNORMAL HIGH (ref 70–91)

## 2022-05-30 LAB — CBC
Hematocrit: 33.2 % — ABNORMAL LOW (ref 34.0–46.6)
Hemoglobin: 10.8 g/dL — ABNORMAL LOW (ref 11.1–15.9)
MCH: 25.9 pg — ABNORMAL LOW (ref 26.6–33.0)
MCHC: 32.5 g/dL (ref 31.5–35.7)
MCV: 80 fL (ref 79–97)
Platelets: 261 10*3/uL (ref 150–450)
RBC: 4.17 x10E6/uL (ref 3.77–5.28)
RDW: 13.7 % (ref 11.7–15.4)
WBC: 9.4 10*3/uL (ref 3.4–10.8)

## 2022-06-04 ENCOUNTER — Telehealth: Payer: Self-pay | Admitting: Emergency Medicine

## 2022-06-04 NOTE — Telephone Encounter (Signed)
TC to patient to discuss GDM diagnosis. Pt is self- pay, recommendations for OTC supplies.

## 2022-06-11 ENCOUNTER — Ambulatory Visit (INDEPENDENT_AMBULATORY_CARE_PROVIDER_SITE_OTHER): Payer: Self-pay | Admitting: Registered"

## 2022-06-11 ENCOUNTER — Encounter: Payer: Self-pay | Attending: Internal Medicine | Admitting: Registered"

## 2022-06-11 DIAGNOSIS — Z3A31 31 weeks gestation of pregnancy: Secondary | ICD-10-CM

## 2022-06-11 DIAGNOSIS — Z713 Dietary counseling and surveillance: Secondary | ICD-10-CM | POA: Insufficient documentation

## 2022-06-11 DIAGNOSIS — O24410 Gestational diabetes mellitus in pregnancy, diet controlled: Secondary | ICD-10-CM | POA: Insufficient documentation

## 2022-06-11 DIAGNOSIS — O24419 Gestational diabetes mellitus in pregnancy, unspecified control: Secondary | ICD-10-CM

## 2022-06-11 NOTE — Progress Notes (Signed)
Patient was seen for Gestational Diabetes self-management on 06/11/22 Patient assumed appt was at the [facility] location, and rushed to the [facility] location and apologized for being late. Was not able to cover all the material, but patient states she feels she has her questions answered and will read the handout later for more detailed information.  Start time 1445 and End time 1515   [employer] video interpreter Amedeo Gory [phone]  Estimated due date: 08/09/2022  Clinical: Medications: reviewed Medical History: reviewed Labs: OGTT 106(H)-190(H)-127, A1c 5.5%   SMBG:  Fasting: 94-112 mg/dL PPBG: 16-579 mg/dL (avg 038)  Dietary and Lifestyle History: Pt states she stopped drinking starbucks. One morning when had starbucks feta cheese wrap with sweet coffee PPBG was very elevated.  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  Physical Activity: ADL Stress: not assessed Sleep: not assessed  24 hr Recall: not assessed  NUTRITION INTERVENTION  Nutrition education (E-1) on the following topics:   Initial Follow-up  [x]  []  Definition of Gestational Diabetes []  []  Why dietary management is important in controlling blood glucose []  []  Effects each nutrient has on blood glucose levels []  []  Simple carbohydrates vs complex carbohydrates []  []  Fluid intake [x]  []  Creating a balanced meal plan [x]  []  Carbohydrate counting  [x]  []  When to check blood glucose levels [x]  []  Proper blood glucose monitoring techniques [x]  []  Effect of stress and stress reduction techniques  [x]  []  Exercise effect on blood glucose levels, appropriate exercise during pregnancy []  []  Importance of limiting caffeine and abstaining from alcohol and smoking []  []  Medications used for blood sugar control during pregnancy []  []  Hypoglycemia and rule of 15 [x]  []  Postpartum self care  Blood glucose monitor given: Prodigy Lot # Lot # B-[id number] (strips) Exp: 2023-11-10 (strips) CBG: 125  mg/dL  Patient instructed to monitor glucose levels: FBS: 60 - ? 95 mg/dL (some clinics use 90 for cutoff) 1 hour: ? 140 mg/dL 2 hour: ? mg/dL  Patient received handouts: Nutrition Diabetes and Pregnancy Carbohydrate Counting List  Patient will be seen for follow-up as needed.

## 2022-06-12 ENCOUNTER — Ambulatory Visit: Payer: Self-pay

## 2022-06-12 ENCOUNTER — Encounter: Payer: Self-pay | Admitting: Obstetrics

## 2022-06-12 ENCOUNTER — Ambulatory Visit (INDEPENDENT_AMBULATORY_CARE_PROVIDER_SITE_OTHER): Payer: Self-pay | Admitting: Obstetrics

## 2022-06-12 VITALS — BP 101/65 | HR 80 | Wt 201.0 lb

## 2022-06-12 DIAGNOSIS — O24410 Gestational diabetes mellitus in pregnancy, diet controlled: Secondary | ICD-10-CM

## 2022-06-12 DIAGNOSIS — Z3A31 31 weeks gestation of pregnancy: Secondary | ICD-10-CM

## 2022-06-12 DIAGNOSIS — O0993 Supervision of high risk pregnancy, unspecified, third trimester: Secondary | ICD-10-CM

## 2022-06-12 DIAGNOSIS — O0990 Supervision of high risk pregnancy, unspecified, unspecified trimester: Secondary | ICD-10-CM

## 2022-06-12 DIAGNOSIS — Z3480 Encounter for supervision of other normal pregnancy, unspecified trimester: Secondary | ICD-10-CM

## 2022-06-12 NOTE — Progress Notes (Signed)
Subjective:  Yvette Avila is a 31 y.o. T6O0600 at [redacted]w[redacted]d being seen today for ongoing prenatal care.  She is currently monitored for the following issues for this high-risk pregnancy and has H/O molar pregnancy, antepartum; Alpha thalassemia silent carrier; Supervision of other normal pregnancy, antepartum; and Anemia affecting pregnancy in first trimester on their problem list.  Patient reports no complaints.  Contractions: Not present. Vag. Bleeding: None.  Movement: Present. Denies leaking of fluid.   The following portions of the patient's history were reviewed and updated as appropriate: allergies, current medications, past family history, past medical history, past social history, past surgical history and problem list. Problem list updated.  Objective:   Vitals:   06/12/22 0909  BP: 101/65  Pulse: 80  Weight: 201 lb (91.2 kg)    Fetal Status: Fetal Heart Rate (bpm): 137   Movement: Present     General:  Alert, oriented and cooperative. Patient is in no acute distress.  Skin: Skin is warm and dry. No rash noted.   Cardiovascular: Normal heart rate noted  Respiratory: Normal respiratory effort, no problems with respiration noted  Abdomen: Soft, gravid, appropriate for gestational age. Pain/Pressure: Absent     Pelvic:  Cervical exam deferred        Extremities: Normal range of motion.  Edema: None  Mental Status: Normal mood and affect. Normal behavior. Normal judgment and thought content.   Urinalysis:      Assessment and Plan:  Pregnancy: K5T9774 at [redacted]w[redacted]d  1. Supervision of high risk pregnancy, antepartum  2. Diet controlled gestational diabetes mellitus (GDM) in third trimester - just started self-glucose monitoring - FBS = 87-97,  and 2 hour PP's= 110-120's  Preterm labor symptoms and general obstetric precautions including but not limited to vaginal bleeding, contractions, leaking of fluid and fetal movement were reviewed in detail with the patient. Please  refer to After Visit Summary for other counseling recommendations.   Return in about 2 weeks (around 06/26/2022) for [facility].   Brock Bad, MD 06/12/22

## 2022-06-19 ENCOUNTER — Other Ambulatory Visit: Payer: Self-pay | Admitting: *Deleted

## 2022-06-19 ENCOUNTER — Ambulatory Visit: Payer: Self-pay | Attending: Obstetrics and Gynecology

## 2022-06-19 ENCOUNTER — Ambulatory Visit: Payer: Self-pay | Admitting: *Deleted

## 2022-06-19 VITALS — BP 109/53 | HR 79

## 2022-06-19 DIAGNOSIS — Z3A32 32 weeks gestation of pregnancy: Secondary | ICD-10-CM

## 2022-06-19 DIAGNOSIS — Z3689 Encounter for other specified antenatal screening: Secondary | ICD-10-CM | POA: Insufficient documentation

## 2022-06-19 DIAGNOSIS — O99212 Obesity complicating pregnancy, second trimester: Secondary | ICD-10-CM | POA: Insufficient documentation

## 2022-06-19 DIAGNOSIS — O285 Abnormal chromosomal and genetic finding on antenatal screening of mother: Secondary | ICD-10-CM

## 2022-06-19 DIAGNOSIS — R638 Other symptoms and signs concerning food and fluid intake: Secondary | ICD-10-CM

## 2022-06-19 DIAGNOSIS — E669 Obesity, unspecified: Secondary | ICD-10-CM

## 2022-06-19 DIAGNOSIS — O24410 Gestational diabetes mellitus in pregnancy, diet controlled: Secondary | ICD-10-CM

## 2022-06-19 DIAGNOSIS — D563 Thalassemia minor: Secondary | ICD-10-CM

## 2022-06-19 DIAGNOSIS — O09213 Supervision of pregnancy with history of pre-term labor, third trimester: Secondary | ICD-10-CM

## 2022-06-19 DIAGNOSIS — O99213 Obesity complicating pregnancy, third trimester: Secondary | ICD-10-CM

## 2022-06-26 ENCOUNTER — Telehealth (INDEPENDENT_AMBULATORY_CARE_PROVIDER_SITE_OTHER): Payer: Self-pay | Admitting: Obstetrics

## 2022-06-26 ENCOUNTER — Encounter: Payer: Self-pay | Admitting: Obstetrics

## 2022-06-26 DIAGNOSIS — Z3A33 33 weeks gestation of pregnancy: Secondary | ICD-10-CM

## 2022-06-26 DIAGNOSIS — O24410 Gestational diabetes mellitus in pregnancy, diet controlled: Secondary | ICD-10-CM

## 2022-06-26 DIAGNOSIS — O0993 Supervision of high risk pregnancy, unspecified, third trimester: Secondary | ICD-10-CM

## 2022-06-26 DIAGNOSIS — O0990 Supervision of high risk pregnancy, unspecified, unspecified trimester: Secondary | ICD-10-CM

## 2022-06-26 MED ORDER — METFORMIN HCL ER 500 MG PO TB24: 1000.0000 mg | ORAL_TABLET | Freq: Two times a day (BID) | ORAL | 5 refills | Status: DC

## 2022-06-26 NOTE — Progress Notes (Signed)
Subjective:  Yvette Avila is a 31 y.o. I7P8242 at [redacted]w[redacted]d being seen today for ongoing prenatal care.  She is currently monitored for the following issues for this high-risk pregnancy and has H/O molar pregnancy, antepartum; Alpha thalassemia silent carrier; Supervision of other normal pregnancy, antepartum; and Anemia affecting pregnancy in first trimester on their problem list.  Patient reports no complaints.  Contractions: Not present. Vag. Bleeding: None.  Movement: Present. Denies leaking of fluid.   The following portions of the patient's history were reviewed and updated as appropriate: allergies, current medications, past family history, past medical history, past social history, past surgical history and problem list. Problem list updated.  Objective:  There were no vitals filed for this visit.  Fetal Status:     Movement: Present     General:  Alert, oriented and cooperative. Patient is in no acute distress.  Skin: Skin is warm and dry. No rash noted.   Cardiovascular: Normal heart rate noted  Respiratory: Normal respiratory effort, no problems with respiration noted  Abdomen: Soft, gravid, appropriate for gestational age. Pain/Pressure: Present     Pelvic:  Cervical exam deferred        Extremities: Normal range of motion.  Edema: None  Mental Status: Normal mood and affect. Normal behavior. Normal judgment and thought content.   Urinalysis:      Assessment and Plan:  Pregnancy: P5T6144 at [redacted]w[redacted]d  1. Supervision of high risk pregnancy, antepartum  2. Diet controlled gestational diabetes mellitus (GDM) in third trimester - FBS=90-110        2 hour PP=110-120 Rx: - metFORMIN (GLUCOPHAGE-XR) 500 MG 24 hr tablet; Take 2 tablets (1,000 mg total) by mouth 2 (two) times daily with a meal.  Dispense: 120 tablet; Refill: 5   I have spent a total of 10 minutes of non-face-to-face time, excluding clinical staff time, reviewing notes and preparing to see patient, ordering tests  and/or medications, and counseling the patient.    Preterm labor symptoms and general obstetric precautions including but not limited to vaginal bleeding, contractions, leaking of fluid and fetal movement were reviewed in detail with the patient. Please refer to After Visit Summary for other counseling recommendations.   Return in about 2 weeks (around 07/10/2022) for [facility].   Brock Bad, MD 06/26/2022

## 2022-06-26 NOTE — Progress Notes (Signed)
S/w pt for virtual visit. Pt reports fetal movement with some pressure. Pt stats that she does not have BP cuff with her but she can take it at work and sent reading through [place] today.

## 2022-07-08 ENCOUNTER — Encounter: Payer: Self-pay | Admitting: Obstetrics and Gynecology

## 2022-07-08 DIAGNOSIS — O24419 Gestational diabetes mellitus in pregnancy, unspecified control: Secondary | ICD-10-CM | POA: Insufficient documentation

## 2022-07-08 NOTE — L&D Delivery Note (Signed)
OB/GYN Faculty Practice Delivery Note  Yvette Avila is a 32 y.o. [id number] s/p vag del at [redacted]w[redacted]d. She was admitted for IOL due to [facility] with suboptimal control as well as possible macrosomia (EFW 92% 3678gm).   ROM: 4h 63m with clear fluid GBS Status: neg Maximum Maternal Temperature: 98.0  Labor Progress: Yvette Avila was admitted before noon on 07/24/22 and started on Pitocin as an induction method; there was some questions of SROM, however she still had palpable membranes without leaking, so she had AROM that evening prior to progressing to complete and pushing once to delivery.  Delivery Date/Time: January 18th, 2024 at 0047 Delivery: Called to room at [place] for delivery; upon my arrival, pt had just pushed once to delivery. No nuchal cord present. Shoulder and body delivered in usual fashion per RN. Infant with spontaneous cry; dried and stimulated. Placed on mother's abd and cord clamped x 2 after 1-minute delay, and cut by FOB. Cord blood drawn. Placenta delivered spontaneously with gentle cord traction. Fundus firm with massage and Pitocin. Labia, perineum, vagina, and cervix inspected and found to be intact.   Placenta: spont, intact; to L&D Complications: delivered prior to provider arrival Lacerations: none EBL: 233cc Analgesia: none  Postpartum Planning [x]  message to sent to schedule follow-up   Infant: boy  APGARs 8/9  3200g (7lb 0.9oz)  [name], CNM  07/25/2022 1:10 AM

## 2022-07-10 ENCOUNTER — Other Ambulatory Visit (HOSPITAL_COMMUNITY)
Admission: RE | Admit: 2022-07-10 | Discharge: 2022-07-10 | Disposition: A | Discharge status: Home / Self Care | Payer: Self-pay | Source: Ambulatory Visit | Attending: Obstetrics and Gynecology | Admitting: Obstetrics and Gynecology

## 2022-07-10 ENCOUNTER — Ambulatory Visit (INDEPENDENT_AMBULATORY_CARE_PROVIDER_SITE_OTHER): Payer: Self-pay | Admitting: Obstetrics and Gynecology

## 2022-07-10 VITALS — BP 97/64 | HR 80 | Wt 192.0 lb

## 2022-07-10 DIAGNOSIS — Z3A35 35 weeks gestation of pregnancy: Secondary | ICD-10-CM

## 2022-07-10 DIAGNOSIS — Z3480 Encounter for supervision of other normal pregnancy, unspecified trimester: Secondary | ICD-10-CM | POA: Insufficient documentation

## 2022-07-10 DIAGNOSIS — Z3483 Encounter for supervision of other normal pregnancy, third trimester: Secondary | ICD-10-CM

## 2022-07-10 DIAGNOSIS — O24415 Gestational diabetes mellitus in pregnancy, controlled by oral hypoglycemic drugs: Secondary | ICD-10-CM

## 2022-07-10 MED ORDER — HUMULIN N KWIKPEN 100 UNIT/ML ~~LOC~~ SUPN: 18.0000 [IU] | PEN_INJECTOR | Freq: Every evening | SUBCUTANEOUS | 1 refills | Status: DC

## 2022-07-10 MED ORDER — HUMULIN N KWIKPEN 100 UNIT/ML ~~LOC~~ SUPN: 34.0000 [IU] | PEN_INJECTOR | Freq: Every evening | SUBCUTANEOUS | 1 refills | Status: DC

## 2022-07-10 MED ORDER — INSULIN PEN NEEDLE 32G X 4 MM MISC: 1.0000 | Freq: Four times a day (QID) | 0 refills | Status: DC

## 2022-07-10 MED ORDER — INSULIN LISPRO (0.5 UNIT DIAL) 100 UNIT/ML (KWIKPEN JR): 8.0000 [IU] | PEN_INJECTOR | Freq: Three times a day (TID) | SUBCUTANEOUS | 1 refills | Status: DC

## 2022-07-10 NOTE — Patient Instructions (Addendum)
Send me your blood sugars by MyChart in 3 days Humalog pen 8 units with meals - inject as soon as you start your meal Humulin 18 units before bedtime  If you feel nausea, dizzy, lightheaded - please check your blood sugar. If in 50-60s, please eat a high carb snack (like crackers) & protein snack and recheck in 30 minutes.  Please ask the pharmacist to review your medications with you to make sure you understand how to inject insulin

## 2022-07-10 NOTE — Progress Notes (Signed)
   PRENATAL VISIT NOTE  Subjective:  Yvette Avila is a 32 y.o. H3Z1696 at [redacted]w[redacted]d being seen today for ongoing prenatal care.  She is currently monitored for the following issues for this high-risk pregnancy and has H/O molar pregnancy, antepartum; Alpha thalassemia silent carrier; Supervision of other normal pregnancy, antepartum; Anemia affecting pregnancy in first trimester; and GDM (gestational diabetes mellitus) on their problem list.  Patient reports she is doing well. Contractions: Not present. Vag. Bleeding: None.  Movement: Present. Denies leaking of fluid.   BG reviewed -pan elevated with fasting typically in low 100s and 2hPP 130s despite metformin 1000mg  BID.   The following portions of the patient's history were reviewed and updated as appropriate: allergies, current medications, past family history, past medical history, past social history, past surgical history and problem list.   Objective:   Vitals:   07/10/22 0825  BP: 97/64  Pulse: 80  Weight: 192 lb (87.1 kg)    Fetal Status: Fetal Heart Rate (bpm): 132   Movement: Present     General:  Alert, oriented and cooperative. Patient is in no acute distress.  Skin: Skin is warm and dry. No rash noted.   Cardiovascular: Normal heart rate noted  Respiratory: Normal respiratory effort, no problems with respiration noted  Abdomen: Soft, gravid, appropriate for gestational age.  Pain/Pressure: Present      Assessment and Plan:  Pregnancy: V8L3810 at [redacted]w[redacted]d 1. Supervision of other normal pregnancy, antepartum - Culture, beta strep (group b only) - Cervicovaginal ancillary only( [place])  2. Gestational diabetes mellitus (GDM) in third trimester controlled on oral hypoglycemic drug - Inadequately controlled on metformin 1g BID with pan elevated values both fasting and postprandial. Discussed initiating insulin and reviewed prices on GoodRx. Price appears to be acceptable & patient plans to pick up her insulin  today at 2pm.  - Initially discussed full weight based regimen, but given pt will continue metformin, called to confirm dosing with her at 12:50p. Will do humulin 18u qHS & humalog 8u TID meals. - Counseled on signs/symptoms of hypoglycemia and how to increase blood sugar - Pt will send MyChart message with blood sugars in 3 days for review - NST 2x/wk given medications. NST today reactive & reassuring  - Discussed TOD - currently AGA. if BG remains uncontrolled or if LGA on next growth (1/15), would recommend IOL at 37 weeks. If BG controlled & growth appropriate, plan for IOL by 39 weeks - Fetal nonstress test; Future  Please refer to After Visit Summary for other counseling recommendations.   Return in about 1 week (around 07/17/2022) for high risk return OB.  Future Appointments  Date Time Provider [place]  07/12/2022  9:00 AM [place] None  07/17/2022  8:35 AM Griffin Basil, MD [place] None  07/19/2022  9:00 AM [place] None  07/22/2022  3:15 PM [facility] NURSE [facility]  07/22/2022  3:30 PM [facility] US2 [facility]  07/24/2022  8:35 AM [place] Mode, MD [place] None  07/31/2022  8:35 AM Chancy Milroy, MD [place] None  08/07/2022  9:35 AM Chancy Milroy, MD [place] None  08/14/2022  8:35 AM Gavin Pound, [place] None   Inez Catalina, MD

## 2022-07-10 NOTE — Progress Notes (Addendum)
ROB/GBS/NST. 

## 2022-07-11 ENCOUNTER — Ambulatory Visit (INDEPENDENT_AMBULATORY_CARE_PROVIDER_SITE_OTHER): Payer: Self-pay | Admitting: Registered"

## 2022-07-11 ENCOUNTER — Encounter: Payer: Self-pay | Admitting: Obstetrics and Gynecology

## 2022-07-11 DIAGNOSIS — Z3A35 35 weeks gestation of pregnancy: Secondary | ICD-10-CM

## 2022-07-11 DIAGNOSIS — O24414 Gestational diabetes mellitus in pregnancy, insulin controlled: Secondary | ICD-10-CM

## 2022-07-11 LAB — CERVICOVAGINAL ANCILLARY ONLY
Chlamydia: NEGATIVE
Comment: NEGATIVE
Comment: NORMAL
Neisseria Gonorrhea: NEGATIVE

## 2022-07-11 NOTE — Progress Notes (Signed)
Insulin Instruction  Patient was seen on for insulin instruction.   Start: 1414     End: 1435  Pt has already started using insulin after watching videos provided by her doctor yesterday.   FBS this morning was 90 mg/dL; 2 hours after breakfast 99 mg/dL  MD orders are:   NPH 18 units qhs  Humalog Kwikpen 8 u with meals  The following learning objectives were met by the patient during this visit:   Insulin Action of NPH and R insulins  Reviewed insulin pens  Hygiene and storage  Rotation of Sites  Hypoglycemia- symptoms, causes, treatment choices  Record keeping and MD follow up  Patient demonstrated understanding of insulin administration by return demonstration.  Patient received the following handouts: Insulin Instruction Handout                                        Patient to continue using insulin as Rx'd by MD

## 2022-07-12 ENCOUNTER — Ambulatory Visit: Payer: Self-pay

## 2022-07-12 VITALS — BP 100/65 | HR 78 | Wt 194.9 lb

## 2022-07-12 DIAGNOSIS — O0990 Supervision of high risk pregnancy, unspecified, unspecified trimester: Secondary | ICD-10-CM

## 2022-07-12 NOTE — Progress Notes (Signed)
Pt is in the office for NST only NST was reactive and reviewed by Dr. Jodi Mourning Pt has no complaints today and reports positive fetal movement Next appt is 07/17/2022

## 2022-07-14 LAB — CULTURE, BETA STREP (GROUP B ONLY): Strep Gp B Culture: NEGATIVE

## 2022-07-17 ENCOUNTER — Ambulatory Visit (INDEPENDENT_AMBULATORY_CARE_PROVIDER_SITE_OTHER): Payer: Self-pay | Admitting: Obstetrics and Gynecology

## 2022-07-17 VITALS — BP 102/65 | HR 67 | Wt 193.0 lb

## 2022-07-17 DIAGNOSIS — Z3490 Encounter for supervision of normal pregnancy, unspecified, unspecified trimester: Secondary | ICD-10-CM

## 2022-07-17 DIAGNOSIS — Z3480 Encounter for supervision of other normal pregnancy, unspecified trimester: Secondary | ICD-10-CM

## 2022-07-17 DIAGNOSIS — Z3A36 36 weeks gestation of pregnancy: Secondary | ICD-10-CM

## 2022-07-17 DIAGNOSIS — D563 Thalassemia minor: Secondary | ICD-10-CM

## 2022-07-17 DIAGNOSIS — Z3483 Encounter for supervision of other normal pregnancy, third trimester: Secondary | ICD-10-CM

## 2022-07-17 DIAGNOSIS — O24414 Gestational diabetes mellitus in pregnancy, insulin controlled: Secondary | ICD-10-CM

## 2022-07-17 NOTE — Progress Notes (Signed)
Pt states her glucose reading are doing well.   Pt sent mychart msg over the weekend regarding sugars and delivery plan.  Was noted may deliver at 39 weeks unless sugars are not controlled.

## 2022-07-17 NOTE — Progress Notes (Signed)
   PRENATAL VISIT NOTE  Subjective:  Yvette Avila is a 32 y.o. D3T7017 at [redacted]w[redacted]d being seen today for ongoing prenatal care.  She is currently monitored for the following issues for this high-risk pregnancy and has H/O molar pregnancy, antepartum; Alpha thalassemia silent carrier; Supervision of other normal pregnancy, antepartum; Anemia affecting pregnancy in first trimester; Language barrier; and GDM (gestational diabetes mellitus) on their problem list.  Patient doing well with no acute concerns today. She reports no complaints.  Contractions: Irregular. Vag. Bleeding: None.  Movement: Present. Denies leaking of fluid.   The following portions of the patient's history were reviewed and updated as appropriate: allergies, current medications, past family history, past medical history, past social history, past surgical history and problem list. Problem list updated.  Objective:   Vitals:   07/17/22 0854  BP: 102/65  Pulse: 67  Weight: 193 lb (87.5 kg)    Fetal Status: Fetal Heart Rate (bpm): 140 Fundal Height: 36 cm Movement: Present     General:  Alert, oriented and cooperative. Patient is in no acute distress.  Skin: Skin is warm and dry. No rash noted.   Cardiovascular: Normal heart rate noted  Respiratory: Normal respiratory effort, no problems with respiration noted  Abdomen: Soft, gravid, appropriate for gestational age.  Pain/Pressure: Absent     Pelvic: Cervical exam deferred        Extremities: Normal range of motion.     Mental Status:  Normal mood and affect. Normal behavior. Normal judgment and thought content.  NST:  baseline 130s, moderate variability, accels noted, no decels, Cateogory 1 strip, reactive Assessment and Plan:  Pregnancy: B9T9030 at [redacted]w[redacted]d  1. [redacted] weeks gestation of pregnancy   2. Insulin controlled gestational diabetes mellitus (GDM) in third trimester FBS: 88-97 PPBS: avg 106 Will switch to  weekly BPP until delivery IOL at 39 weeks  -  Fetal nonstress test; Future - [place] MFM FETAL BPP W/NONSTRESS; Future  3. Alpha thalassemia silent carrier   4. Supervision of other normal pregnancy, antepartum Continue routine prenatal care  Preterm labor symptoms and general obstetric precautions including but not limited to vaginal bleeding, contractions, leaking of fluid and fetal movement were reviewed in detail with the patient.  Please refer to After Visit Summary for other counseling recommendations.   Return in about 1 week (around 07/24/2022) for [facility], in person.   Lynnda Shields, MD Faculty Attending [facility]

## 2022-07-19 ENCOUNTER — Ambulatory Visit (INDEPENDENT_AMBULATORY_CARE_PROVIDER_SITE_OTHER): Payer: Self-pay

## 2022-07-19 DIAGNOSIS — Z3A37 37 weeks gestation of pregnancy: Secondary | ICD-10-CM

## 2022-07-19 DIAGNOSIS — O24415 Gestational diabetes mellitus in pregnancy, controlled by oral hypoglycemic drugs: Secondary | ICD-10-CM

## 2022-07-19 NOTE — Progress Notes (Signed)
Patient presents for weekly NST. Pt reports +FM and irregular contractions. Pt does not have any concerns at this time. NST reviewed by Dr. Elly Modena.

## 2022-07-22 ENCOUNTER — Encounter: Payer: Self-pay | Admitting: *Deleted

## 2022-07-22 ENCOUNTER — Ambulatory Visit: Payer: Self-pay | Attending: Maternal & Fetal Medicine

## 2022-07-22 ENCOUNTER — Ambulatory Visit: Payer: Self-pay | Admitting: *Deleted

## 2022-07-22 ENCOUNTER — Other Ambulatory Visit: Payer: Self-pay | Admitting: Maternal & Fetal Medicine

## 2022-07-22 DIAGNOSIS — O24410 Gestational diabetes mellitus in pregnancy, diet controlled: Secondary | ICD-10-CM | POA: Insufficient documentation

## 2022-07-22 DIAGNOSIS — O99213 Obesity complicating pregnancy, third trimester: Secondary | ICD-10-CM | POA: Diagnosis present

## 2022-07-22 DIAGNOSIS — O285 Abnormal chromosomal and genetic finding on antenatal screening of mother: Secondary | ICD-10-CM

## 2022-07-22 DIAGNOSIS — Z3A37 37 weeks gestation of pregnancy: Secondary | ICD-10-CM

## 2022-07-22 DIAGNOSIS — R638 Other symptoms and signs concerning food and fluid intake: Secondary | ICD-10-CM

## 2022-07-22 DIAGNOSIS — O24415 Gestational diabetes mellitus in pregnancy, controlled by oral hypoglycemic drugs: Secondary | ICD-10-CM

## 2022-07-22 DIAGNOSIS — O09A3 Supervision of pregnancy with history of molar pregnancy, third trimester: Secondary | ICD-10-CM

## 2022-07-22 DIAGNOSIS — O3663X0 Maternal care for excessive fetal growth, third trimester, not applicable or unspecified: Secondary | ICD-10-CM

## 2022-07-22 DIAGNOSIS — E669 Obesity, unspecified: Secondary | ICD-10-CM

## 2022-07-22 DIAGNOSIS — D563 Thalassemia minor: Secondary | ICD-10-CM

## 2022-07-24 ENCOUNTER — Encounter (HOSPITAL_COMMUNITY): Payer: Self-pay | Admitting: Obstetrics and Gynecology

## 2022-07-24 ENCOUNTER — Other Ambulatory Visit: Payer: Self-pay

## 2022-07-24 ENCOUNTER — Ambulatory Visit (INDEPENDENT_AMBULATORY_CARE_PROVIDER_SITE_OTHER): Payer: Self-pay | Admitting: Obstetrics & Gynecology

## 2022-07-24 ENCOUNTER — Inpatient Hospital Stay (HOSPITAL_COMMUNITY)
Admission: RE | Admit: 2022-07-24 | Discharge: 2022-07-26 | DRG: 807 | Disposition: A | Discharge status: Home / Self Care | Payer: Medicaid Other | Attending: Obstetrics and Gynecology | Admitting: Obstetrics and Gynecology

## 2022-07-24 VITALS — BP 125/79 | HR 85 | Wt 195.0 lb

## 2022-07-24 DIAGNOSIS — Z3A37 37 weeks gestation of pregnancy: Secondary | ICD-10-CM | POA: Diagnosis not present

## 2022-07-24 DIAGNOSIS — O24414 Gestational diabetes mellitus in pregnancy, insulin controlled: Secondary | ICD-10-CM | POA: Diagnosis present

## 2022-07-24 DIAGNOSIS — O99214 Obesity complicating childbirth: Secondary | ICD-10-CM | POA: Diagnosis present

## 2022-07-24 DIAGNOSIS — O4202 Full-term premature rupture of membranes, onset of labor within 24 hours of rupture: Secondary | ICD-10-CM | POA: Diagnosis not present

## 2022-07-24 DIAGNOSIS — O99011 Anemia complicating pregnancy, first trimester: Secondary | ICD-10-CM

## 2022-07-24 DIAGNOSIS — O3663X0 Maternal care for excessive fetal growth, third trimester, not applicable or unspecified: Secondary | ICD-10-CM | POA: Diagnosis not present

## 2022-07-24 DIAGNOSIS — Z3490 Encounter for supervision of normal pregnancy, unspecified, unspecified trimester: Secondary | ICD-10-CM

## 2022-07-24 DIAGNOSIS — O24424 Gestational diabetes mellitus in childbirth, insulin controlled: Principal | ICD-10-CM | POA: Diagnosis present

## 2022-07-24 DIAGNOSIS — O9902 Anemia complicating childbirth: Secondary | ICD-10-CM | POA: Diagnosis present

## 2022-07-24 DIAGNOSIS — Z87891 Personal history of nicotine dependence: Secondary | ICD-10-CM | POA: Diagnosis not present

## 2022-07-24 DIAGNOSIS — O99013 Anemia complicating pregnancy, third trimester: Secondary | ICD-10-CM

## 2022-07-24 DIAGNOSIS — D563 Thalassemia minor: Secondary | ICD-10-CM | POA: Diagnosis present

## 2022-07-24 DIAGNOSIS — Z148 Genetic carrier of other disease: Secondary | ICD-10-CM

## 2022-07-24 DIAGNOSIS — Z3480 Encounter for supervision of other normal pregnancy, unspecified trimester: Secondary | ICD-10-CM

## 2022-07-24 LAB — GLUCOSE, CAPILLARY
Glucose-Capillary: 58 mg/dL — ABNORMAL LOW (ref 70–99)
Glucose-Capillary: 96 mg/dL (ref 70–99)
Glucose-Capillary: 133 mg/dL — ABNORMAL HIGH (ref 70–99)
Glucose-Capillary: 70 mg/dL (ref 70–99)

## 2022-07-24 LAB — COMPREHENSIVE METABOLIC PANEL
ALT: 10 U/L (ref 0–44)
AST: 13 U/L — ABNORMAL LOW (ref 15–41)
Albumin: 3.3 g/dL — ABNORMAL LOW (ref 3.5–5.0)
Alkaline Phosphatase: 147 U/L — ABNORMAL HIGH (ref 38–126)
Anion gap: 8 (ref 5–15)
BUN: 10 mg/dL (ref 6–20)
CO2: 19 mmol/L — ABNORMAL LOW (ref 22–32)
Calcium: 8.5 mg/dL — ABNORMAL LOW (ref 8.9–10.3)
Chloride: 107 mmol/L (ref 98–111)
Creatinine, Ser: 0.48 mg/dL (ref 0.44–1.00)
GFR, Estimated: >60 mL/min (ref >60)
Glucose, Bld: 80 mg/dL (ref 70–99)
Potassium: 3.8 mmol/L (ref 3.5–5.1)
Sodium: 134 mmol/L — ABNORMAL LOW (ref 135–145)
Total Bilirubin: 0.5 mg/dL (ref 0.3–1.2)
Total Protein: 6.2 g/dL — ABNORMAL LOW (ref 6.5–8.1)

## 2022-07-24 LAB — CBC
HCT: 34 % — ABNORMAL LOW (ref 36.0–46.0)
Hemoglobin: 11.1 g/dL — ABNORMAL LOW (ref 12.0–15.0)
MCH: 26.4 pg (ref 26.0–34.0)
MCHC: 32.6 g/dL (ref 30.0–36.0)
MCV: 81 fL (ref 80.0–100.0)
Platelets: 288 10*3/uL (ref 150–400)
RBC: 4.2 MIL/uL (ref 3.87–5.11)
RDW: 15 % (ref 11.5–15.5)
WBC: 7.9 10*3/uL (ref 4.0–10.5)
nRBC: 0 % (ref 0.0–0.2)

## 2022-07-24 LAB — RPR: RPR Ser Ql: NONREACTIVE

## 2022-07-24 LAB — TYPE AND SCREEN
ABO/RH(D): O POS
Antibody Screen: NEGATIVE

## 2022-07-24 MED ORDER — MISOPROSTOL 25 MCG QUARTER TABLET: 25.0000 ug | ORAL_TABLET | Freq: Once | ORAL | Status: DC

## 2022-07-24 MED ORDER — OXYTOCIN BOLUS FROM INFUSION
333.0000 mL | Freq: Once | INTRAVENOUS | Status: AC
  Administered 2022-07-25: 333 mL via INTRAVENOUS

## 2022-07-24 MED ORDER — OXYTOCIN-SODIUM CHLORIDE 30-0.9 UT/500ML-% IV SOLN
1.0000 m[IU]/min | INTRAVENOUS | Status: DC
  Administered 2022-07-24 (×2): 2 m[IU]/min via INTRAVENOUS
  Filled 2022-07-24: qty 500

## 2022-07-24 MED ORDER — ONDANSETRON HCL 4 MG/2ML IJ SOLN: 4.0000 mg | Freq: Four times a day (QID) | INTRAMUSCULAR | Status: DC | PRN

## 2022-07-24 MED ORDER — OXYTOCIN-SODIUM CHLORIDE 30-0.9 UT/500ML-% IV SOLN: 1.0000 m[IU]/min | INTRAVENOUS | Status: DC

## 2022-07-24 MED ORDER — SOD CITRATE-CITRIC ACID 500-334 MG/5ML PO SOLN: 30.0000 mL | ORAL | Status: DC | PRN

## 2022-07-24 MED ORDER — HUMULIN N KWIKPEN 100 UNIT/ML ~~LOC~~ SUPN: 22.0000 [IU] | PEN_INJECTOR | Freq: Every evening | SUBCUTANEOUS | 1 refills | Status: DC

## 2022-07-24 MED ORDER — LIDOCAINE HCL (PF) 1 % IJ SOLN: 30.0000 mL | INTRAMUSCULAR | Status: DC | PRN

## 2022-07-24 MED ORDER — OXYCODONE-ACETAMINOPHEN 5-325 MG PO TABS: 1.0000 | ORAL_TABLET | ORAL | Status: DC | PRN

## 2022-07-24 MED ORDER — TERBUTALINE SULFATE 1 MG/ML IJ SOLN: 0.2500 mg | Freq: Once | INTRAMUSCULAR | Status: DC | PRN

## 2022-07-24 MED ORDER — LACTATED RINGERS IV SOLN
500.0000 mL | INTRAVENOUS | Status: DC | PRN
  Administered 2022-07-24: 500 mL via INTRAVENOUS
  Administered 2022-07-24 (×2): 1000 mL via INTRAVENOUS

## 2022-07-24 MED ORDER — FENTANYL CITRATE (PF) 100 MCG/2ML IJ SOLN: 50.0000 ug | INTRAMUSCULAR | Status: DC | PRN

## 2022-07-24 MED ORDER — OXYTOCIN-SODIUM CHLORIDE 30-0.9 UT/500ML-% IV SOLN: 2.5000 [IU]/h | INTRAVENOUS | Status: DC

## 2022-07-24 MED ORDER — ACETAMINOPHEN 325 MG PO TABS
650.0000 mg | ORAL_TABLET | ORAL | Status: DC | PRN
  Administered 2022-07-25: 650 mg via ORAL
  Filled 2022-07-24: qty 2

## 2022-07-24 MED ORDER — LACTATED RINGERS IV SOLN: INTRAVENOUS | Status: DC

## 2022-07-24 MED ORDER — MISOPROSTOL 50MCG HALF TABLET: 50.0000 ug | ORAL_TABLET | Freq: Once | ORAL | Status: DC

## 2022-07-24 NOTE — Progress Notes (Signed)
   PRENATAL VISIT NOTE  Subjective:  Yvette Avila is a 32 y.o. [id number] at [redacted]w[redacted]d being seen today for ongoing prenatal care.  She is currently monitored for the following issues for this high-risk pregnancy and has H/O molar pregnancy, antepartum; Alpha thalassemia silent carrier; Supervision of other normal pregnancy, antepartum; Anemia affecting pregnancy in first trimester; Language barrier; and GDM (gestational diabetes mellitus) on their problem list.  Patient reports no complaints.  Contractions: Irregular. Vag. Bleeding: None.  Movement: Present. Denies leaking of fluid.   The following portions of the patient's history were reviewed and updated as appropriate: allergies, current medications, past family history, past medical history, past social history, past surgical history and problem list.   Objective:   Vitals:   07/24/22 0849  BP: 125/79  Pulse: 85  Weight: 88.5 kg    Fetal Status: Fetal Heart Rate (bpm): 135   Movement: Present     General:  Alert, oriented and cooperative. Patient is in no acute distress.  Skin: Skin is warm and dry. No rash noted.   Cardiovascular: Normal heart rate noted  Respiratory: Normal respiratory effort, no problems with respiration noted  Abdomen: Soft, gravid, appropriate for gestational age.  Pain/Pressure: Present     Pelvic: Cervical exam deferred        Extremities: Normal range of motion.     Mental Status: Normal mood and affect. Normal behavior. Normal judgment and thought content.   Assessment and Plan:  Pregnancy: D7O2423 at [redacted]w[redacted]d 1. Insulin controlled gestational diabetes mellitus (GDM) in third trimester Fasting blood sugar has been elevated. Increased insulin to 22 units every night.  2. Supervision of other normal pregnancy, antepartum   3. Anemia affecting pregnancy in first trimester   Term labor symptoms and general obstetric precautions including but not limited to vaginal bleeding, contractions, leaking of  fluid and fetal movement were reviewed in detail with the patient. Please refer to After Visit Summary for other counseling recommendations.   No follow-ups on file.  Future Appointments  Date Time Provider [place]  07/29/2022 11:15 AM [facility] NURSE [facility]  07/29/2022 11:30 AM [facility] US2 [facility]  07/31/2022  8:35 AM Chancy Milroy, MD [place] None  08/02/2022  7:15 AM [facility] SCHED ROOM [facility] None  08/05/2022  9:15 AM [facility] NURSE [facility]  08/05/2022  9:30 AM [facility] US3 [facility]  08/07/2022  9:35 AM Chancy Milroy, MD [place] None  08/14/2022  8:35 AM Gavin Pound, CNM [facility] None    Talmage Coin Hagmeier, Student-PA  Attestation of Attending Supervision of PA Student: Evaluation and management procedures were performed by the PA student under my supervision and collaboration.  I have reviewed the student's note and chart, and I agree with the management and plan.  Emeterio Reeve, MD, [place] Attending [place], [facility]

## 2022-07-24 NOTE — Progress Notes (Signed)
Pt states MFM recommends delivery this week. Pt states fasting glucose has been a little elevated.

## 2022-07-24 NOTE — H&P (Signed)
OBSTETRIC ADMISSION HISTORY AND PHYSICAL  Yvette Avila is a 32 y.o. female [phone] with IUP at [redacted]w[redacted]d presenting for IOL for uncontrolled GDM. She reports +FMs. No LOF, VB, blurry vision, headaches, peripheral edema, or RUQ pain. She plans on breastfeeding. She requests IUD or Nexplanon for birth control.  Dating: By LMP --->  Estimated Date of Delivery: 08/09/22  Sono:    @[redacted]w[redacted]d , normal anatomy, cephalic presentation, 4132G, 92%ile, EFW 3678  Prenatal History/Complications: - obesity - A2GDM on Metformin and Insulin - alpha thal carrier - anemia of pregnancy - previous macrosomic infant; no complications at delivery  Past Medical History: Past Medical History:  Diagnosis Date   Medical history non-contributory     Past Surgical History: Past Surgical History:  Procedure Laterality Date   DILATION AND CURETTAGE OF UTERUS      Obstetrical History: OB History     Gravida  6   Para  4   Term  4   Preterm      AB  1   Living  4      SAB  1   IAB      Ectopic      Multiple  0   Live Births  4           Social History: Social History   Socioeconomic History   Marital status: Married    Spouse name: Alejanro(Alex)   Number of children: 4   Years of education: 12 +CNA   Highest education level: Not on file  Occupational History   Occupation: CNA/reception/interpreter     Comment: [employer], water quality  Tobacco Use   Smoking status: Former    Packs/day: 0.25    Types: Cigarettes   Smokeless tobacco: Never   Tobacco comments:    stressed with her father being ill and started back up.  Vaping Use   Vaping Use: Never used  Substance and Sexual Activity   Alcohol use: Yes    Alcohol/week: 0.0 standard drinks of alcohol    Comment: Occasional   Drug use: No   Sexual activity: Yes    Birth control/protection: Implant, I.U.D.  Other Topics Concern   Not on file  Social History Narrative   Not on file   Social Determinants of  Health   Financial Resource Strain: Not on file  Food Insecurity: No Food Insecurity (07/24/2022)   Hunger Vital Sign    Worried About Running Out of Food in the Last Year: Never true    Ran Out of Food in the Last Year: Never true  Transportation Needs: No Transportation Needs (07/24/2022)   PRAPARE - Hydrologist (Medical): No    Lack of Transportation (Non-Medical): No  Physical Activity: Not on file  Stress: Not on file  Social Connections: Not on file    Family History: Family History  Problem Relation Age of Onset   Hyperlipidemia Mother    Migraines Mother    Diabetes Father    Hypertension Father    Hyperlipidemia Father    Heart disease Father    Eczema Son    Allergies Son    Ulcerative colitis Son    Allergies Maternal Aunt    Asthma Brother     Allergies: No Known Allergies  Medications Prior to Admission  Medication Sig Dispense Refill Last Dose   ferrous sulfate (FERROUSUL) 325 (65 FE) MG tablet Take 1 tablet (325 mg total) by mouth every other day. 30 tablet 2 Past  Week   Insulin lispro (HUMALOG JUNIOR KWIKPEN) 100 UNIT/ML Inject 8 Units into the skin with breakfast, with lunch, and with evening meal. 6 mL 1 07/24/2022   Insulin NPH, Human,, Isophane, (HUMULIN N KWIKPEN) 100 UNIT/ML Kiwkpen Inject 22 Units into the skin at bedtime. 9 mL 1 07/23/2022   metFORMIN (GLUCOPHAGE-XR) 500 MG 24 hr tablet Take 2 tablets (1,000 mg total) by mouth 2 (two) times daily with a meal. 120 tablet 5 07/23/2022   Prenatal MV & Min w/FA-DHA (PRENATAL GUMMIES) 0.18-25 MG CHEW Chew by mouth.   Past Week   acetaminophen (TYLENOL) 325 MG tablet Take 2 tablets (650 mg total) by mouth every 6 (six) hours as needed (for pain scale < 4). 30 tablet 0    Blood Pressure Monitoring (BLOOD PRESSURE KIT) DEVI 1 Device by Does not apply route as needed. 1 each 0    Insulin Pen Needle 32G X 4 MM MISC 1 Needle by Does not apply route in the morning, at noon, in the  evening, and at bedtime. 120 each 0      Review of Systems:  All systems reviewed and negative except as stated in HPI  PE: Last menstrual period 11/02/2021, unknown if currently breastfeeding. General appearance: alert, cooperative, and no distress Lungs: regular rate and effort Heart: regular rate  Abdomen: soft, non-tender Extremities: Homans sign is negative, no sign of DVT Presentation: cephalic per RN exam EFM: 135 bpm, mod variability, + accels, no decels Toco: rare Dilation: 2.5 Effacement (%): 50 Station: -1 Exam by:: Nilsa Nutting, RN  Prenatal labs: ABO, Rh: O/Positive/-- (07/24 1519) Antibody: Negative (07/24 1519) Rubella: 10.40 (07/24 1519) RPR: Non Reactive (11/22 1040)  HBsAg: Negative (07/24 1519)  HIV: Non Reactive (11/22 1040)  GBS: Negative/-- (01/03 0950)  2 hr GTT 106/190/127  Prenatal Transfer Tool  Maternal Diabetes: Yes:  Diabetes Type:  Insulin/Medication controlled Genetic Screening: Normal Maternal Ultrasounds/Referrals: Normal Fetal Ultrasounds or other Referrals:  Referred to Materal Fetal Medicine  Maternal Substance Abuse:  No Significant Maternal Medications:  Meds include: Other:  MTF, Insulin Significant Maternal Lab Results: Group B Strep negative  No results found for this or any previous visit (from the past 24 hour(s)).  Patient Active Problem List   Diagnosis Date Noted   Insulin controlled gestational diabetes mellitus (GDM) in third trimester 07/24/2022   GDM (gestational diabetes mellitus) 07/08/2022   Language barrier 03/27/2022   Anemia affecting pregnancy in first trimester 02/01/2022   Supervision of other normal pregnancy, antepartum 01/28/2022   Alpha thalassemia silent carrier 04/19/2019   H/O molar pregnancy, antepartum 04/06/2019    Assessment: Yvette Avila is a 32 y.o. B0F7510 at [redacted]w[redacted]d here for IOL for uncontrolled A2GDM  1. Labor: latent 2. FWB: Cat I 3. Pain: analgesia/anesthesia prn 4. GBS:  neg 5. A2GDM   Plan: Admit to LD IOL with Pitocin, consider AROM when indicated CBGs q4; endotool when indicated Anticipate SVD  Julianne Handler, CNM  07/24/2022, 11:51 AM

## 2022-07-24 NOTE — Progress Notes (Signed)
Yvette Avila is a 32 y.o. P6P9509 at [redacted]w[redacted]d by LMP admitted for induction of labor due to uncontrolled GDM.  Subjective:  Pt is doing well bouncing on the birthing ball and is feeling the contractions more but they are tolerable.   Objective: BP 106/63   Pulse 68   Temp 98 F (36.7 C)   Resp 19   Ht 5\' 2"  (1.575 m)   Wt 88.5 kg   LMP 11/02/2021   BMI 35.67 kg/m  No intake/output data recorded. No intake/output data recorded.  FHT:  FHR: 135 bpm, variability: moderate,  accelerations:  Present,  decelerations:  Absent UC:   irregular, every 3 SVE:   Dilation: 3 Effacement (%): 50 Station: -1 Exam by:: Nilsa Nutting, RN  Labs: Lab Results  Component Value Date   WBC 7.9 07/24/2022   HGB 11.1 (L) 07/24/2022   HCT 34.0 (L) 07/24/2022   MCV 81.0 07/24/2022   PLT 288 07/24/2022    Assessment / Plan: Induction of labor due to gestational diabetes,  progressing well on pitocin  Labor: Progressing on Pitocin, will continue to increase then AROM, there is a question about whether the pt SROMed or not. There is a bag felt on exam, will consider rupturing at next check.  Fetal Wellbeing:  Category I Pain Control:  Labor support without medications I/D:   GBS- Anticipated MOD:  NSVD  Abram Sander, MD 07/24/2022, 4:52 PM

## 2022-07-24 NOTE — Progress Notes (Addendum)
Yvette Avila is a 32 y.o. U5K2706 at [redacted]w[redacted]d by LMP admitted for induction of labor due to uncontrolled GDM.  Subjective: Pt is still feeling well and thinks she is having more contractions.   Objective: BP (!) 93/47 (BP Location: Left Arm)   Pulse 78   Temp 98 F (36.7 C) (Oral)   Resp 20   Ht 5\' 2"  (1.575 m)   Wt 88.5 kg   LMP 11/02/2021   BMI 35.67 kg/m  No intake/output data recorded. No intake/output data recorded.  FHT:  FHR: 130 bpm, variability: moderate,  accelerations:  Present,  decelerations:  Absent UC:   irregular, every 3 SVE:   Dilation: 3.5 Effacement (%): 70 Station: -2 Exam by:: Derrill Memo, CNM  Labs: Lab Results  Component Value Date   WBC 7.9 07/24/2022   HGB 11.1 (L) 07/24/2022   HCT 34.0 (L) 07/24/2022   MCV 81.0 07/24/2022   PLT 288 07/24/2022    Assessment / Plan: Induction of labor due to gestational diabetes,  progressing well on pitocin  Labor: Pt has not changed significantly since last check and a bag is felt and was AROMed successfully. Will continue pitocin.   Fetal Wellbeing:  Category I Pain Control:  Labor support without medications I/D:   GBS- Anticipated MOD:  NSVD  Abram Sander, MD 07/24/2022, 7:57 PM

## 2022-07-25 ENCOUNTER — Encounter (HOSPITAL_COMMUNITY): Payer: Self-pay | Admitting: Obstetrics and Gynecology

## 2022-07-25 DIAGNOSIS — O3663X0 Maternal care for excessive fetal growth, third trimester, not applicable or unspecified: Secondary | ICD-10-CM

## 2022-07-25 DIAGNOSIS — O24424 Gestational diabetes mellitus in childbirth, insulin controlled: Secondary | ICD-10-CM

## 2022-07-25 DIAGNOSIS — O4202 Full-term premature rupture of membranes, onset of labor within 24 hours of rupture: Secondary | ICD-10-CM

## 2022-07-25 DIAGNOSIS — O99214 Obesity complicating childbirth: Secondary | ICD-10-CM

## 2022-07-25 DIAGNOSIS — Z3A37 37 weeks gestation of pregnancy: Secondary | ICD-10-CM

## 2022-07-25 LAB — GLUCOSE, CAPILLARY
Glucose-Capillary: 222 mg/dL — ABNORMAL HIGH (ref 70–99)
Glucose-Capillary: 108 mg/dL — ABNORMAL HIGH (ref 70–99)

## 2022-07-25 MED ORDER — WITCH HAZEL-GLYCERIN EX PADS: 1.0000 | MEDICATED_PAD | CUTANEOUS | Status: DC | PRN

## 2022-07-25 MED ORDER — BENZOCAINE-MENTHOL 20-0.5 % EX AERO: 1.0000 | INHALATION_SPRAY | CUTANEOUS | Status: DC | PRN

## 2022-07-25 MED ORDER — COCONUT OIL OIL: 1.0000 | TOPICAL_OIL | Status: DC | PRN

## 2022-07-25 MED ORDER — TETANUS-DIPHTH-ACELL PERTUSSIS 5-2.5-18.5 LF-MCG/0.5 IM SUSY: 0.5000 mL | PREFILLED_SYRINGE | Freq: Once | INTRAMUSCULAR | Status: DC

## 2022-07-25 MED ORDER — OXYCODONE HCL 5 MG PO TABS: 5.0000 mg | ORAL_TABLET | ORAL | Status: DC | PRN

## 2022-07-25 MED ORDER — MEASLES, MUMPS & RUBELLA VAC IJ SOLR: 0.5000 mL | Freq: Once | INTRAMUSCULAR | Status: DC

## 2022-07-25 MED ORDER — DIPHENHYDRAMINE HCL 25 MG PO CAPS: 25.0000 mg | ORAL_CAPSULE | Freq: Four times a day (QID) | ORAL | Status: DC | PRN

## 2022-07-25 MED ORDER — IBUPROFEN 600 MG PO TABS
600.0000 mg | ORAL_TABLET | Freq: Four times a day (QID) | ORAL | Status: DC
  Administered 2022-07-25 – 2022-07-26 (×6): 600 mg via ORAL
  Filled 2022-07-25 (×6): qty 1

## 2022-07-25 MED ORDER — ZOLPIDEM TARTRATE 5 MG PO TABS: 5.0000 mg | ORAL_TABLET | Freq: Every evening | ORAL | Status: DC | PRN

## 2022-07-25 MED ORDER — DIBUCAINE (PERIANAL) 1 % EX OINT: 1.0000 | TOPICAL_OINTMENT | CUTANEOUS | Status: DC | PRN

## 2022-07-25 MED ORDER — ONDANSETRON HCL 4 MG/2ML IJ SOLN: 4.0000 mg | INTRAMUSCULAR | Status: DC | PRN

## 2022-07-25 MED ORDER — PRENATAL MULTIVITAMIN CH
1.0000 | ORAL_TABLET | Freq: Every day | ORAL | Status: DC
  Administered 2022-07-25 – 2022-07-26 (×2): 1 via ORAL
  Filled 2022-07-25 (×2): qty 1

## 2022-07-25 MED ORDER — SIMETHICONE 80 MG PO CHEW: 80.0000 mg | CHEWABLE_TABLET | ORAL | Status: DC | PRN

## 2022-07-25 MED ORDER — ONDANSETRON HCL 4 MG PO TABS: 4.0000 mg | ORAL_TABLET | ORAL | Status: DC | PRN

## 2022-07-25 MED ORDER — SENNOSIDES-DOCUSATE SODIUM 8.6-50 MG PO TABS
2.0000 | ORAL_TABLET | ORAL | Status: DC
  Administered 2022-07-25 – 2022-07-26 (×2): 2 via ORAL
  Filled 2022-07-25 (×2): qty 2

## 2022-07-25 MED ORDER — ACETAMINOPHEN 325 MG PO TABS: 650.0000 mg | ORAL_TABLET | ORAL | Status: DC | PRN

## 2022-07-25 NOTE — Discharge Summary (Signed)
Postpartum Discharge Summary   Patient Name: Yvette Avila DOB: [birth year] MRN: [id number]  Date of admission: 07/24/2022 Delivery date:07/25/2022  Delivering provider: Serita Grammes D  Date of discharge: 07/26/2022  Admitting diagnosis: Insulin controlled gestational diabetes mellitus (GDM) in third trimester [O24.414] Intrauterine pregnancy: [redacted]w[redacted]d     Secondary diagnosis:  Principal Problem:   Insulin controlled gestational diabetes mellitus (GDM) in third trimester Active Problems:   Alpha thalassemia silent carrier  Additional problems: none    Discharge diagnosis: Term Pregnancy Delivered and GDM A2                                              Post partum procedures: None Augmentation: AROM and Pitocin Complications: None  Hospital course: Induction of Labor With Vaginal Delivery   32 y.o. yo [id number] at [redacted]w[redacted]d was admitted to the hospital 07/24/2022 for induction of labor.  Indication for induction: A2 DM with suboptimal control and possible macrosomia (EFW 92%, 3678gm).  Patient had an uncomplicated labor course. Membrane Rupture Time/Date: 7:52 PM ,07/24/2022   Delivery Method:Vaginal, Spontaneous  Episiotomy: None  Lacerations:  None  Details of delivery can be found in separate delivery note.  Patient had a uncomplicated postpartum course. Her PPD#1 fasting CBG was 87 Patient is discharged home 07/26/22.  Newborn Data: Birth date:07/25/2022  Birth time:12:47 AM  Gender:Female  Living status:Living  Apgars:8 ,9  Weight:3200 g (7lb 0.9oz)  Magnesium Sulfate received: No BMZ received: No Rhophylac:N/A MMR:N/A T-DaP: unsure if given prenatally Flu: No Transfusion:No  Physical exam  Vitals:   07/25/22 1200 07/25/22 1600 07/25/22 2100 07/26/22 0530  BP: 112/73 102/67 109/70 104/73  Pulse: 78 82 70 76  Resp: 18 16 18 18   Temp: 98 F (36.7 C) 98.4 F (36.9 C) 98.5 F (36.9 C) 98.6 F (37 C)  TempSrc: Oral Oral Oral Oral  SpO2: 100% 100% 100% 100%   Weight:      Height:       General: alert, cooperative, and no distress Lochia: appropriate Uterine Fundus: firm Incision: N/A DVT Evaluation: No evidence of DVT seen on physical exam. Labs: Lab Results  Component Value Date   WBC 7.9 07/24/2022   HGB 11.1 (L) 07/24/2022   HCT 34.0 (L) 07/24/2022   MCV 81.0 07/24/2022   PLT 288 07/24/2022      Latest Ref Rng & Units 07/24/2022   11:22 AM  CMP  Glucose 70 - 99 mg/dL 80   BUN 6 - 20 mg/dL 10   Creatinine 0.44 - 1.00 mg/dL 0.48   Sodium 135 - 145 mmol/L 134   Potassium 3.5 - 5.1 mmol/L 3.8   Chloride 98 - 111 mmol/L 107   CO2 22 - 32 mmol/L 19   Calcium 8.9 - 10.3 mg/dL 8.5   Total Protein 6.5 - 8.1 g/dL 6.2   Total Bilirubin 0.3 - 1.2 mg/dL 0.5   Alkaline Phos 38 - 126 U/L 147   AST 15 - 41 U/L 13   ALT 0 - 44 U/L 10    Edinburgh Score:    07/25/2022   12:00 PM  Edinburgh Postnatal Depression Scale Screening Tool  I have been able to laugh and see the funny side of things. 0  I have looked forward with enjoyment to things. 0  I have blamed myself unnecessarily when things went wrong. 0  I  have been anxious or worried for no good reason. 0  I have felt scared or panicky for no good reason. 0  Things have been getting on top of me. 0  I have been so unhappy that I have had difficulty sleeping. 0  I have felt sad or miserable. 0  I have been so unhappy that I have been crying. 0  The thought of harming myself has occurred to me. 0  Edinburgh Postnatal Depression Scale Total 0     After visit meds:  Allergies as of 07/26/2022   No Known Allergies      Medication List     STOP taking these medications    HumuLIN N KwikPen 100 UNIT/ML Kiwkpen Generic drug: Insulin NPH (Human) (Isophane)   Insulin lispro 100 UNIT/ML Commonly known as: HUMALOG Junior KwikPen   Insulin Pen Needle 32G X 4 MM Misc   metFORMIN 500 MG 24 hr tablet Commonly known as: GLUCOPHAGE-XR       TAKE these medications     acetaminophen 325 MG tablet Commonly known as: Tylenol Take 2 tablets (650 mg total) by mouth every 6 (six) hours as needed (for pain scale < 4).   benzocaine-Menthol 20-0.5 % Aero Commonly known as: DERMOPLAST Apply 1 Application topically as needed for irritation (perineal discomfort).   Blood Pressure Kit Devi 1 Device by Does not apply route as needed.   ferrous sulfate 325 (65 FE) MG tablet Commonly known as: FerrouSul Take 1 tablet (325 mg total) by mouth every other day.   ibuprofen 600 MG tablet Commonly known as: ADVIL Take 1 tablet (600 mg total) by mouth every 6 (six) hours.   Prenatal Gummies 0.18-25 MG Chew Chew by mouth.         Discharge home in stable condition Infant Feeding: Breast Infant Disposition:home with mother Discharge instruction: per After Visit Summary and Postpartum booklet. Activity: Advance as tolerated. Pelvic rest for 6 weeks.  Diet: routine diet Future Appointments: Future Appointments  Date Time Provider [place]  09/05/2022  9:15 AM [facility] None  09/05/2022 10:15 AM Woodroe Mode, MD [place] None   Follow up Visit:  [name], CNM  P [facility] Please schedule this patient for Postpartum visit in: 6 weeks with the following provider: Any provider In-Person For C/S patients schedule nurse incision check in weeks 2 weeks: no High risk pregnancy complicated by: GDMA2 Delivery mode:  SVD Anticipated Birth Control:  LARC at HD PP Procedures needed: 2 hour GTT Schedule Integrated [place] visit: no   07/26/2022 Concepcion Living, MD

## 2022-07-25 NOTE — Lactation Note (Signed)
This note was copied from a baby's chart. Lactation Consultation Note  Patient Name: Yvette Avila [id number] Date: 07/25/2022 Reason for consult: Initial assessment;Early term 37-38.6wks Age:34 hours   LC Note:  Per RN, mother declined lactation services.   Maternal Data    Feeding Mother's Current Feeding Choice: Breast Milk and Formula  LATCH Score                    Lactation Tools Discussed/Used    Interventions    Discharge    Consult Status Consult Status: Complete (mother declined follow up)    Loveta Dellis R Kaisha Wachob 07/25/2022, 6:10 AM

## 2022-07-25 NOTE — Progress Notes (Signed)
Patient ID: Yvette Avila, female   DOB: July 24, 1990, 32 y.o.   MRN: [id number]  Pitocin stopped from 2200-2240 due to early variables/tachysystole; now feeling ctx as more uncomfortable and getting closer  BPs 103/60, 107/60 FHR 150s, +accels, +LTV, early variables Ctx q 3-5 mins with Pit at 32mu/min Cx deferred (was 5+/80/vtx -1 @ 2145)  CBGs: 70, 133, 96, 58  IUP@37 .6wks GDMA2 Latent labor  Keep ctx reg w Pitocin Anticipate vag del  [name] [facility] 07/25/2022 12:29 AM

## 2022-07-25 NOTE — Plan of Care (Signed)

## 2022-07-26 ENCOUNTER — Other Ambulatory Visit (HOSPITAL_COMMUNITY): Payer: Self-pay

## 2022-07-26 LAB — BIRTH TISSUE RECOVERY COLLECTION (PLACENTA DONATION)

## 2022-07-26 LAB — GLUCOSE, CAPILLARY: Glucose-Capillary: 87 mg/dL (ref 70–99)

## 2022-07-26 MED ORDER — BENZOCAINE-MENTHOL 20-0.5 % EX AERO
1.0000 | INHALATION_SPRAY | CUTANEOUS | 0 refills | Status: AC | PRN
  Filled 2022-07-26: qty 78, fill #0

## 2022-07-26 MED ORDER — IBUPROFEN 600 MG PO TABS
600.0000 mg | ORAL_TABLET | Freq: Four times a day (QID) | ORAL | 0 refills | Status: AC
  Filled 2022-07-26: qty 30, 8d supply, fill #0

## 2022-07-29 ENCOUNTER — Other Ambulatory Visit (HOSPITAL_COMMUNITY): Payer: Self-pay

## 2022-07-29 ENCOUNTER — Ambulatory Visit: Payer: Medicaid Other

## 2022-07-31 ENCOUNTER — Encounter: Payer: Self-pay | Admitting: Obstetrics and Gynecology

## 2022-08-02 ENCOUNTER — Telehealth (HOSPITAL_COMMUNITY): Payer: Self-pay | Admitting: *Deleted

## 2022-08-02 ENCOUNTER — Inpatient Hospital Stay (HOSPITAL_COMMUNITY): Payer: Medicaid Other

## 2022-08-02 NOTE — Telephone Encounter (Signed)
Mom reports feeling good. No concerns about herself at this time. EPDS not completed, but mom feeling emotionally well. [facility] score=0) Mom reports baby is doing well. Feeding, peeing, and pooping without difficulty. Safe sleep reviewed. Mom reports no concerns about baby at present.  Odis Hollingshead, RN 08-02-2022 at 12:05pm

## 2022-08-05 ENCOUNTER — Ambulatory Visit: Payer: Self-pay

## 2022-08-05 ENCOUNTER — Other Ambulatory Visit: Payer: Self-pay

## 2022-08-07 ENCOUNTER — Encounter: Payer: Self-pay | Admitting: Obstetrics and Gynecology

## 2022-08-30 ENCOUNTER — Other Ambulatory Visit: Payer: Self-pay

## 2022-08-30 DIAGNOSIS — J029 Acute pharyngitis, unspecified: Secondary | ICD-10-CM

## 2022-08-30 LAB — POCT RAPID STREP A (OFFICE): Rapid Strep A Screen: NEGATIVE

## 2022-08-30 LAB — POCT INFLUENZA A/B
Influenza A, POC: NEGATIVE
Influenza B, POC: NEGATIVE

## 2022-08-30 LAB — POC COVID19 BINAXNOW: SARS Coronavirus 2 Ag: NEGATIVE

## 2022-09-05 ENCOUNTER — Other Ambulatory Visit: Payer: Self-pay

## 2022-09-05 ENCOUNTER — Encounter: Payer: Self-pay | Admitting: Obstetrics & Gynecology

## 2022-09-05 NOTE — Progress Notes (Signed)
6 weeks PP, Pt is unable to do her PP GTT.  She is reschedule for 09/06/22 @ 9:15 am.  She will go to the [facility] for Paragard IUD.  Appointment was marked canceled

## 2022-09-06 ENCOUNTER — Other Ambulatory Visit: Payer: Self-pay

## 2023-05-20 ENCOUNTER — Telehealth: Payer: Self-pay | Admitting: Internal Medicine

## 2023-05-20 MED ORDER — VALACYCLOVIR HCL 1 G PO TABS: ORAL_TABLET | ORAL | 4 refills | Status: AC

## 2023-05-20 NOTE — Telephone Encounter (Signed)
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
